# Patient Record
Sex: Male | Born: 1945 | Race: White | Hispanic: No | Marital: Married | Smoking: Former smoker
Health system: Southern US, Community
[De-identification: ages and names within clinical notes are randomized; demographics above are authoritative.]

## PROBLEM LIST (undated history)

## (undated) DIAGNOSIS — G4733 Obstructive sleep apnea (adult) (pediatric): Secondary | ICD-10-CM

## (undated) DIAGNOSIS — I4819 Other persistent atrial fibrillation: Secondary | ICD-10-CM

## (undated) DIAGNOSIS — I10 Essential (primary) hypertension: Secondary | ICD-10-CM

## (undated) DIAGNOSIS — E291 Testicular hypofunction: Secondary | ICD-10-CM

## (undated) DIAGNOSIS — M1990 Unspecified osteoarthritis, unspecified site: Secondary | ICD-10-CM

## (undated) DIAGNOSIS — G2581 Restless legs syndrome: Secondary | ICD-10-CM

## (undated) DIAGNOSIS — Z8739 Personal history of other diseases of the musculoskeletal system and connective tissue: Secondary | ICD-10-CM

## (undated) DIAGNOSIS — R7303 Prediabetes: Secondary | ICD-10-CM

## (undated) DIAGNOSIS — E785 Hyperlipidemia, unspecified: Secondary | ICD-10-CM

## (undated) HISTORY — DX: Other persistent atrial fibrillation: I48.19

## (undated) HISTORY — PX: NO PAST SURGERIES: SHX2092

---

## 2014-09-12 DIAGNOSIS — G2581 Restless legs syndrome: Secondary | ICD-10-CM | POA: Insufficient documentation

## 2017-02-24 ENCOUNTER — Emergency Department: Payer: Medicare Other

## 2017-02-24 ENCOUNTER — Ambulatory Visit (INDEPENDENT_AMBULATORY_CARE_PROVIDER_SITE_OTHER)
Admission: EM | Admit: 2017-02-24 | Discharge: 2017-02-24 | Disposition: A | Discharge status: Other Acute Inpatient Hospital | Payer: Medicare Other | Source: Home / Self Care | Attending: Family Medicine | Admitting: Family Medicine

## 2017-02-24 ENCOUNTER — Encounter: Payer: Self-pay | Admitting: Emergency Medicine

## 2017-02-24 ENCOUNTER — Other Ambulatory Visit: Payer: Self-pay

## 2017-02-24 ENCOUNTER — Observation Stay
Admission: EM | Admit: 2017-02-24 | Discharge: 2017-02-25 | Disposition: A | Discharge status: Home / Self Care | Payer: Medicare Other | Attending: Internal Medicine | Admitting: Internal Medicine

## 2017-02-24 DIAGNOSIS — E7800 Pure hypercholesterolemia, unspecified: Secondary | ICD-10-CM | POA: Insufficient documentation

## 2017-02-24 DIAGNOSIS — R5381 Other malaise: Secondary | ICD-10-CM | POA: Insufficient documentation

## 2017-02-24 DIAGNOSIS — E785 Hyperlipidemia, unspecified: Secondary | ICD-10-CM | POA: Insufficient documentation

## 2017-02-24 DIAGNOSIS — R7303 Prediabetes: Secondary | ICD-10-CM | POA: Diagnosis not present

## 2017-02-24 DIAGNOSIS — R531 Weakness: Secondary | ICD-10-CM | POA: Diagnosis not present

## 2017-02-24 DIAGNOSIS — R42 Dizziness and giddiness: Secondary | ICD-10-CM

## 2017-02-24 DIAGNOSIS — Z79899 Other long term (current) drug therapy: Secondary | ICD-10-CM | POA: Insufficient documentation

## 2017-02-24 DIAGNOSIS — G2581 Restless legs syndrome: Secondary | ICD-10-CM | POA: Insufficient documentation

## 2017-02-24 DIAGNOSIS — M109 Gout, unspecified: Secondary | ICD-10-CM | POA: Diagnosis not present

## 2017-02-24 DIAGNOSIS — Z809 Family history of malignant neoplasm, unspecified: Secondary | ICD-10-CM | POA: Diagnosis not present

## 2017-02-24 DIAGNOSIS — R739 Hyperglycemia, unspecified: Secondary | ICD-10-CM | POA: Diagnosis not present

## 2017-02-24 DIAGNOSIS — Z7982 Long term (current) use of aspirin: Secondary | ICD-10-CM | POA: Insufficient documentation

## 2017-02-24 DIAGNOSIS — G4733 Obstructive sleep apnea (adult) (pediatric): Secondary | ICD-10-CM | POA: Diagnosis not present

## 2017-02-24 DIAGNOSIS — R5383 Other fatigue: Secondary | ICD-10-CM | POA: Insufficient documentation

## 2017-02-24 DIAGNOSIS — I4891 Unspecified atrial fibrillation: Secondary | ICD-10-CM | POA: Diagnosis present

## 2017-02-24 DIAGNOSIS — I481 Persistent atrial fibrillation: Principal | ICD-10-CM | POA: Insufficient documentation

## 2017-02-24 DIAGNOSIS — Z6833 Body mass index (BMI) 33.0-33.9, adult: Secondary | ICD-10-CM | POA: Insufficient documentation

## 2017-02-24 DIAGNOSIS — M1990 Unspecified osteoarthritis, unspecified site: Secondary | ICD-10-CM | POA: Diagnosis not present

## 2017-02-24 DIAGNOSIS — I10 Essential (primary) hypertension: Secondary | ICD-10-CM | POA: Diagnosis not present

## 2017-02-24 HISTORY — DX: Personal history of other diseases of the musculoskeletal system and connective tissue: Z87.39

## 2017-02-24 HISTORY — DX: Obstructive sleep apnea (adult) (pediatric): G47.33

## 2017-02-24 HISTORY — DX: Essential (primary) hypertension: I10

## 2017-02-24 HISTORY — DX: Restless legs syndrome: G25.81

## 2017-02-24 HISTORY — DX: Testicular hypofunction: E29.1

## 2017-02-24 HISTORY — DX: Hyperlipidemia, unspecified: E78.5

## 2017-02-24 HISTORY — DX: Prediabetes: R73.03

## 2017-02-24 HISTORY — DX: Unspecified osteoarthritis, unspecified site: M19.90

## 2017-02-24 LAB — MAGNESIUM: MAGNESIUM: 2 mg/dL (ref 1.7–2.4)

## 2017-02-24 LAB — APTT: APTT: 27 s (ref 24–36)

## 2017-02-24 LAB — CBC WITH DIFFERENTIAL/PLATELET
BASOS PCT: 1 %
Basophils Absolute: 0.1 10*3/uL (ref 0–0.1)
Eosinophils Absolute: 0.2 10*3/uL (ref 0–0.7)
Eosinophils Relative: 4 %
HEMATOCRIT: 54 % — AB (ref 40.0–52.0)
HEMOGLOBIN: 18.3 g/dL — AB (ref 13.0–18.0)
LYMPHS ABS: 1.1 10*3/uL (ref 1.0–3.6)
Lymphocytes Relative: 18 %
MCH: 31.7 pg (ref 26.0–34.0)
MCHC: 34 g/dL (ref 32.0–36.0)
MCV: 93.1 fL (ref 80.0–100.0)
MONOS PCT: 11 %
Monocytes Absolute: 0.7 10*3/uL (ref 0.2–1.0)
NEUTROS ABS: 4 10*3/uL (ref 1.4–6.5)
NEUTROS PCT: 66 %
Platelets: 211 10*3/uL (ref 150–440)
RBC: 5.79 MIL/uL (ref 4.40–5.90)
RDW: 13.6 % (ref 11.5–14.5)
WBC: 6.1 10*3/uL (ref 3.8–10.6)

## 2017-02-24 LAB — TROPONIN I: Troponin I: <0.03 ng/mL (ref <0.03)

## 2017-02-24 LAB — BASIC METABOLIC PANEL
Anion gap: 10 (ref 5–15)
BUN: 19 mg/dL (ref 6–20)
CO2: 20 mmol/L — ABNORMAL LOW (ref 22–32)
Calcium: 9.3 mg/dL (ref 8.9–10.3)
Chloride: 107 mmol/L (ref 101–111)
Creatinine, Ser: 0.82 mg/dL (ref 0.61–1.24)
GFR calc Af Amer: >60 mL/min (ref >60)
GLUCOSE: 139 mg/dL — AB (ref 65–99)
Potassium: 4.2 mmol/L (ref 3.5–5.1)
Sodium: 137 mmol/L (ref 135–145)

## 2017-02-24 LAB — URINE DRUG SCREEN, QUALITATIVE (ARMC ONLY)
Amphetamines, Ur Screen: NOT DETECTED
BARBITURATES, UR SCREEN: NOT DETECTED
BENZODIAZEPINE, UR SCRN: NOT DETECTED
COCAINE METABOLITE, UR ~~LOC~~: NOT DETECTED
Cannabinoid 50 Ng, Ur ~~LOC~~: POSITIVE — AB
MDMA (Ecstasy)Ur Screen: NOT DETECTED
METHADONE SCREEN, URINE: NOT DETECTED
Opiate, Ur Screen: NOT DETECTED
Phencyclidine (PCP) Ur S: NOT DETECTED
TRICYCLIC, UR SCREEN: NOT DETECTED

## 2017-02-24 LAB — PROTIME-INR
INR: 1.1
Prothrombin Time: 14.1 seconds (ref 11.4–15.2)

## 2017-02-24 LAB — TSH: TSH: 1.79 u[IU]/mL (ref 0.350–4.500)

## 2017-02-24 LAB — HEMOGLOBIN A1C
Hgb A1c MFr Bld: 6.2 % — ABNORMAL HIGH (ref 4.8–5.6)
Mean Plasma Glucose: 131.24 mg/dL

## 2017-02-24 LAB — HEPARIN LEVEL (UNFRACTIONATED): HEPARIN UNFRACTIONATED: 0.23 [IU]/mL — AB (ref 0.30–0.70)

## 2017-02-24 MED ORDER — FENOFIBRATE 160 MG PO TABS
160.0000 mg | ORAL_TABLET | Freq: Every day | ORAL | Status: DC
  Administered 2017-02-24 – 2017-02-25 (×2): 160 mg via ORAL
  Filled 2017-02-24 (×2): qty 1

## 2017-02-24 MED ORDER — METOPROLOL TARTRATE 25 MG PO TABS
25.0000 mg | ORAL_TABLET | Freq: Four times a day (QID) | ORAL | Status: DC
  Administered 2017-02-25 (×2): 25 mg via ORAL
  Filled 2017-02-24 (×2): qty 1

## 2017-02-24 MED ORDER — HYDROCODONE-ACETAMINOPHEN 5-325 MG PO TABS: 1.0000 | ORAL_TABLET | ORAL | Status: DC | PRN

## 2017-02-24 MED ORDER — METOPROLOL TARTRATE 25 MG PO TABS
25.0000 mg | ORAL_TABLET | Freq: Two times a day (BID) | ORAL | Status: DC
  Administered 2017-02-24: 25 mg via ORAL
  Filled 2017-02-24: qty 1

## 2017-02-24 MED ORDER — GABAPENTIN 300 MG PO CAPS
300.0000 mg | ORAL_CAPSULE | Freq: Three times a day (TID) | ORAL | Status: DC
Start: 2017-02-24 — End: 2017-02-25
  Administered 2017-02-24 – 2017-02-25 (×3): 300 mg via ORAL
  Filled 2017-02-24 (×3): qty 1

## 2017-02-24 MED ORDER — SENNOSIDES-DOCUSATE SODIUM 8.6-50 MG PO TABS: 1.0000 | ORAL_TABLET | Freq: Every evening | ORAL | Status: DC | PRN

## 2017-02-24 MED ORDER — ALLOPURINOL 300 MG PO TABS
300.0000 mg | ORAL_TABLET | Freq: Every day | ORAL | Status: DC
  Administered 2017-02-24 – 2017-02-25 (×2): 300 mg via ORAL
  Filled 2017-02-24 (×2): qty 1

## 2017-02-24 MED ORDER — ASPIRIN 81 MG PO CHEW
81.0000 mg | CHEWABLE_TABLET | Freq: Every day | ORAL | Status: DC
  Administered 2017-02-24 – 2017-02-25 (×2): 81 mg via ORAL
  Filled 2017-02-24 (×2): qty 1

## 2017-02-24 MED ORDER — ALBUTEROL SULFATE (2.5 MG/3ML) 0.083% IN NEBU: 2.5000 mg | INHALATION_SOLUTION | RESPIRATORY_TRACT | Status: DC | PRN

## 2017-02-24 MED ORDER — DILTIAZEM HCL 30 MG PO TABS
60.0000 mg | ORAL_TABLET | Freq: Three times a day (TID) | ORAL | Status: DC
  Administered 2017-02-24: 60 mg via ORAL
  Filled 2017-02-24: qty 2

## 2017-02-24 MED ORDER — SODIUM CHLORIDE 0.9 % IV SOLN: 250.0000 mL | INTRAVENOUS | Status: DC | PRN

## 2017-02-24 MED ORDER — ONDANSETRON HCL 4 MG/2ML IJ SOLN: 4.0000 mg | Freq: Four times a day (QID) | INTRAMUSCULAR | Status: DC | PRN

## 2017-02-24 MED ORDER — ONDANSETRON HCL 4 MG PO TABS: 4.0000 mg | ORAL_TABLET | Freq: Four times a day (QID) | ORAL | Status: DC | PRN

## 2017-02-24 MED ORDER — SODIUM CHLORIDE 0.9% FLUSH
3.0000 mL | Freq: Two times a day (BID) | INTRAVENOUS | Status: DC
  Administered 2017-02-24 – 2017-02-25 (×2): 3 mL via INTRAVENOUS

## 2017-02-24 MED ORDER — SIMVASTATIN 20 MG PO TABS
40.0000 mg | ORAL_TABLET | Freq: Every day | ORAL | Status: DC
  Administered 2017-02-24 – 2017-02-25 (×2): 40 mg via ORAL
  Filled 2017-02-24 (×2): qty 2

## 2017-02-24 MED ORDER — SODIUM CHLORIDE 0.9% FLUSH: 3.0000 mL | INTRAVENOUS | Status: DC | PRN

## 2017-02-24 MED ORDER — HEPARIN BOLUS VIA INFUSION
1500.0000 [IU] | Freq: Once | INTRAVENOUS | Status: AC
  Administered 2017-02-25: 1500 [IU] via INTRAVENOUS
  Filled 2017-02-24: qty 1500

## 2017-02-24 MED ORDER — LOSARTAN POTASSIUM 50 MG PO TABS
100.0000 mg | ORAL_TABLET | Freq: Every day | ORAL | Status: DC
  Administered 2017-02-24 – 2017-02-25 (×2): 100 mg via ORAL
  Filled 2017-02-24 (×2): qty 2

## 2017-02-24 MED ORDER — HEPARIN BOLUS VIA INFUSION
4000.0000 [IU] | Freq: Once | INTRAVENOUS | Status: AC
  Administered 2017-02-24: 4000 [IU] via INTRAVENOUS
  Filled 2017-02-24: qty 4000

## 2017-02-24 MED ORDER — ACETAMINOPHEN 650 MG RE SUPP: 650.0000 mg | Freq: Four times a day (QID) | RECTAL | Status: DC | PRN

## 2017-02-24 MED ORDER — METOPROLOL TARTRATE 5 MG/5ML IV SOLN: 5.0000 mg | INTRAVENOUS | Status: DC | PRN

## 2017-02-24 MED ORDER — HEPARIN (PORCINE) IN NACL 100-0.45 UNIT/ML-% IJ SOLN
1600.0000 [IU]/h | INTRAMUSCULAR | Status: DC
  Administered 2017-02-24: 1400 [IU]/h via INTRAVENOUS
  Administered 2017-02-25: 1600 [IU]/h via INTRAVENOUS
  Filled 2017-02-24 (×2): qty 250

## 2017-02-24 MED ORDER — METOPROLOL TARTRATE 5 MG/5ML IV SOLN
5.0000 mg | Freq: Once | INTRAVENOUS | Status: AC
  Administered 2017-02-24: 5 mg via INTRAVENOUS
  Filled 2017-02-24: qty 5

## 2017-02-24 MED ORDER — ACETAMINOPHEN 325 MG PO TABS: 650.0000 mg | ORAL_TABLET | Freq: Four times a day (QID) | ORAL | Status: DC | PRN

## 2017-02-24 NOTE — Consult Note (Signed)
Cardiology Consultation:   Patient ID: Jimmy Melendez; [id number]; [birth year]   Admit date: 02/24/2017 Date of Consult: 02/24/2017  Primary Care Provider: [facility], [place] [facility] Primary Cardiologist: New to [facility] - consult by End   Patient Profile:   Jimmy Melendez is a 71 y.o. male with a hx of HTN, HLD, RLS, OSA, gout and pre-diabetes who is being seen today for the evaluation of new onset Afib with RVR at the request of Dr. Imogene Burn, MD.  History of Present Illness:   [name] is without any prior known cardiac history. He noticed a persistent dizziness on 03/08/23 that was more pronounced at times over the weekend, but never fully resolved. At that time, there were no associated palpitations. Never with chest pain, diaphoresis, nausea, vomiting, presyncope or syncope. Never had similar symptoms. He took his BP at home and the machine reported "irregular heart beat" prompting him to go to urgent care on 11/13. At urgent care he was noted to be in new onset Afib with RVR with heart rates in the 120s bpm and have a SBP in the 170s mmHg. Because of those findings he was sent to the ED for further evaluation.   Upon his arrival to [facility] he was noted be in Afib with RV with heart rates ranging into the 150s bpm. BP was elevated into the 170s/120s. CXR not acute. EKG as below. WBC 6.1, HGB 18.3, PLT 211, SCr0.82, K+ 4.2, troponin <0.03, TSH 1.790. Urine drug screen positive for marijuana. He was given ASA in the ED along with IV Lopressor 5 mg. Since his admission he has been placed on short-acting diltiazem with improvement in heart rates into the low 100s bpm, remains in Afib. No further dizziness.   Past Medical History:  Diagnosis Date  . Arthritis   . History of gout   . Hyperlipidemia   . Hypertension   . Hypogonadism in male   . Obstructive sleep apnea   . Prediabetes   . Restless legs syndrome (RLS)     Past Surgical History:  Procedure Laterality Date  . NO PAST SURGERIES        Home Meds: Prior to Admission medications   Medication Sig Start Date End Date Taking? Authorizing Provider  acetaminophen (TYLENOL) 650 MG CR tablet Take 650 mg 2 (two) times daily by mouth.    Yes [provider]  allopurinol (ZYLOPRIM) 300 MG tablet Take 300 mg daily by mouth.   Yes [provider]  aspirin 81 MG chewable tablet Chew 81 mg 3 (three) times a week by mouth.    Yes [provider]  fenofibrate 160 MG tablet Take 160 mg daily by mouth.   Yes [provider]  fexofenadine (ALLEGRA) 180 MG tablet Take 180 mg daily as needed by mouth for allergies or rhinitis.   Yes [provider]  gabapentin (NEURONTIN) 300 MG capsule Take 300 mg at bedtime by mouth.    Yes [provider]  losartan (COZAAR) 100 MG tablet Take 100 mg daily by mouth.   Yes [provider]  simvastatin (ZOCOR) 40 MG tablet Take 40 mg daily by mouth.   Yes [provider]    Inpatient Medications: Scheduled Meds: . allopurinol  300 mg Oral Daily  . aspirin  81 mg Oral Daily  . diltiazem  60 mg Oral Q8H  . fenofibrate  160 mg Oral Daily  . gabapentin  300 mg Oral TID  . heparin  4,000 Units Intravenous  Once  . losartan  100 mg Oral Daily  . simvastatin  40 mg Oral Daily  . sodium chloride flush  3 mL Intravenous Q12H   Continuous Infusions: . sodium chloride    . heparin     PRN Meds: sodium chloride, acetaminophen **OR** acetaminophen, albuterol, HYDROcodone-acetaminophen, metoprolol tartrate, ondansetron **OR** ondansetron (ZOFRAN) IV, senna-docusate, sodium chloride flush  Allergies:  No Known Allergies  Social History:   Social History   Socioeconomic History  . Marital status: Married    Spouse name: Not on file  . Number of children: Not on file  . Years of education: Not on file  . Highest education level: Not on file  Social Needs  . Financial resource strain: Not on file  . Food insecurity - worry: Not on file   . Food insecurity - inability: Not on file  . Transportation needs - medical: Not on file  . Transportation needs - non-medical: Not on file  Occupational History  . Not on file  Tobacco Use  . Smoking status: Never Smoker  . Smokeless tobacco: Never Used  Substance and Sexual Activity  . Alcohol use: Yes    Comment: occasionally  . Drug use: No  . Sexual activity: Not on file  Other Topics Concern  . Not on file  Social History Narrative  . Not on file     Family History:  Family History  Problem Relation Age of Onset  . Cancer Father     ROS:  Review of Systems  Constitutional: Positive for malaise/fatigue. Negative for chills, diaphoresis, fever and weight loss.  HENT: Negative for congestion.   Eyes: Negative for discharge and redness.  Respiratory: Negative for cough, hemoptysis, sputum production, shortness of breath and wheezing.   Cardiovascular: Positive for palpitations. Negative for chest pain, orthopnea, claudication, leg swelling and PND.  Gastrointestinal: Negative for abdominal pain, blood in stool, heartburn, melena, nausea and vomiting.  Genitourinary: Negative for hematuria.  Musculoskeletal: Negative for falls and myalgias.  Skin: Negative for rash.  Neurological: Positive for dizziness. Negative for tingling, tremors, sensory change, speech change, focal weakness, loss of consciousness and weakness.  Endo/Heme/Allergies: Does not bruise/bleed easily.  Psychiatric/Behavioral: Negative for substance abuse. The patient is not nervous/anxious.   All other systems reviewed and are negative.     Physical Exam/Data:   Vitals:   02/24/17 1200 02/24/17 1230 02/24/17 1300 02/24/17 1337  BP: (!) 144/89 (!) 148/125 (!) 143/110 (!) 140/98  Pulse: 92 80 67 97  Resp: 12 (!) 22 16 18   Temp:    (!) 97.5 F (36.4 C)  TempSrc:    Oral  SpO2: 94% 95% 96% 95%  Weight:      Height:       No intake or output data in the 24 hours ending 02/24/17 1543 Filed  Weights   02/24/17 1026  Weight: 245 lb (111.1 kg)   Body mass index is 33.23 kg/m.   Physical Exam: General: Well developed, well nourished, in no acute distress. Head: Normocephalic, atraumatic, sclera non-icteric, no xanthomas, nares without discharge.  Neck: Negative for carotid bruits. JVD not elevated. Lungs: Clear bilaterally to auscultation without wheezes, rales, or rhonchi. Breathing is unlabored. Heart: Mildly tachycardic, irregularly irregular with S1 S2. No murmurs, rubs, or gallops appreciated. Abdomen: Soft, non-tender, non-distended with normoactive bowel sounds. No hepatomegaly. No rebound/guarding. No obvious abdominal masses. Msk:  Strength and tone appear normal for age. Extremities: No clubbing or cyanosis. No edema. Distal pedal pulses are 2+  and equal bilaterally. Neuro: Alert and oriented X 3. No facial asymmetry. No focal deficit. Moves all extremities spontaneously. Psych:  Responds to questions appropriately with a normal affect.   EKG:  The EKG was personally reviewed and demonstrates: Afib with RVR, 101 bpm, baseline wandering, left axis deviation, no acute st/t changes Telemetry:  Telemetry was personally reviewed and demonstrates: Afib with RVR, low 10ss to 120s bpm  Weights: Filed Weights   02/24/17 1026  Weight: 245 lb (111.1 kg)    Relevant CV Studies: Echo pending.   Laboratory Data:  Chemistry Recent Labs  Lab 02/24/17 1037  NA 137  K 4.2  CL 107  CO2 20*  GLUCOSE 139*  BUN 19  CREATININE 0.82  CALCIUM 9.3  GFRNONAA >60  GFRAA >60  ANIONGAP 10    No results for input(s): PROT, ALBUMIN, AST, ALT, ALKPHOS, BILITOT in the last 168 hours. Hematology Recent Labs  Lab 02/24/17 1037  WBC 6.1  RBC 5.79  HGB 18.3*  HCT 54.0*  MCV 93.1  MCH 31.7  MCHC 34.0  RDW 13.6  PLT 211   Cardiac Enzymes Recent Labs  Lab 02/24/17 1037  TROPONINI <0.03   No results for input(s): TROPIPOC in the last 168 hours.  BNPNo results for  input(s): BNP, PROBNP in the last 168 hours.  DDimer No results for input(s): DDIMER in the last 168 hours.  Radiology/Studies:  Dg Chest 2 View  Result Date: 02/24/2017 IMPRESSION: No active cardiopulmonary disease. Electronically Signed   By: Kennith CenterEric  Mansell M.D.   On: 02/24/2017 11:04    Assessment and Plan:   1. New onset Afib with RVR:  -Remains in Afib with heart rates in the low 100s bpm at rest -Agree with short-acting diltiazem 30 gm q 6 hours -Add Metoprolol 25 mg bid -Heparin gtt -Continue to cycle troponin to rule out -Can continue ASA for now until he rules out and will be placed on DOAC in place of ASA -CHADS2VASc at least 2 (HTN, age x 1)  -Transition to DOAC on 11/14 once echo is done and reviewed and he has ruled tou -Will need outpatient sleep study -Consider d-dimer -Magnesium pending -At this time he is tolerating Afib without issues. He will need to ambulate to assess for adequate heart rate control prior to discharge. If he demonstrates adequate heart rate control he could follow up as an outpatient with planned DCCV in 3 weeks once he has been adequately anticoagulated  -If he demonstrates poor heart rate control or becomes symptomatic he would require TEE/DCCV prior to discharge  2. Hyperglycemia:  -Check HGB A1c   3. HTN:  -Add metoprolol as above -Diltiazem as above -Losartan  4. HLD:  -Check lipid   5. OSA: -Needs outpatient sleep study   For questions or updates, please contact [facility] Please consult [email or web address] for contact info under Cardiology/STEMI.   Signed, Eula Listenyan Dunn, PA-C [facility] Pager: [phone] 02/24/2017, 3:43 PM

## 2017-02-24 NOTE — ED Provider Notes (Addendum)
[facility] Emergency Department Provider Note  ____________________________________________   I have reviewed the triage vital signs and the nursing notes.   HISTORY  Chief Complaint Dizziness    HPI Burnis MedinDavid Dromgoole is a 71 y.o. male with a history of hypertension, restless leg syndrome, high cholesterol and gout, no history of cardiac disease that he knows of, states he was in his normal state of health when he began to feel somewhat lightheaded over the weekend Saturday he believes.  He has a computer blood monitor and noted that his machine was reading that he was having an irregular pulse so he came in.  Patient does not believe that he took his blood pressure medications today.  He has had no chest pain or shortness of breath no nausea no vomiting, he is not otherwise symptomatically this except for feeling somewhat lightheaded.  Blood pressure was elevated at the urgent care.  He states he was somewhat anxious about his findings.     Past Medical History:  Diagnosis Date  . Arthritis   . History of gout   . Hyperlipidemia   . Hypertension   . Hypogonadism in male   . Obstructive sleep apnea   . Prediabetes   . Restless legs syndrome (RLS)     There are no active problems to display for this patient.   Past Surgical History:  Procedure Laterality Date  . NO PAST SURGERIES      Prior to Admission medications   Medication Sig Start Date End Date Taking? Authorizing Provider  acetaminophen (TYLENOL) 650 MG CR tablet Take 650 mg every 8 (eight) hours as needed by mouth for pain.    [provider]  allopurinol (ZYLOPRIM) 300 MG tablet Take 300 mg daily by mouth.    [provider]  aspirin 81 MG chewable tablet Chew daily by mouth.    [provider]  fenofibrate 160 MG tablet Take 160 mg daily by mouth.    [provider]  gabapentin (NEURONTIN) 300 MG capsule Take 300 mg 3 (three) times daily by mouth.     [provider]  losartan (COZAAR) 100 MG tablet Take 100 mg daily by mouth.    [provider]  simvastatin (ZOCOR) 40 MG tablet Take 40 mg daily by mouth.    [provider]    Allergies Patient has no known allergies.  Family History  Problem Relation Age of Onset  . Cancer Father     Social History Social History   Tobacco Use  . Smoking status: Never Smoker  . Smokeless tobacco: Never Used  Substance Use Topics  . Alcohol use: Yes    Comment: occasionally  . Drug use: No    Review of Systems Constitutional: No fever/chills Eyes: No visual changes. ENT: No sore throat. No stiff neck no neck pain Cardiovascular: Denies chest pain. Respiratory: Denies shortness of breath. Gastrointestinal:   no vomiting.  No diarrhea.  No constipation. Genitourinary: Negative for dysuria. Musculoskeletal: Negative lower extremity swelling Skin: Negative for rash. Neurological: Negative for severe headaches, focal weakness or numbness.   ____________________________________________   PHYSICAL EXAM:  VITAL SIGNS: ED Triage Vitals  Enc Vitals Group     BP 02/24/17 1025 (!) 156/105     Pulse Rate 02/24/17 1025 (!) 59     Resp 02/24/17 1025 14     Temp 02/24/17 1025 98.7 F (37.1 C)     Temp Source 02/24/17 1025 Oral     SpO2 02/24/17  1025 96 %     Weight 02/24/17 1026 245 lb (111.1 kg)     Height 02/24/17 1026 6' (1.829 m)     Head Circumference --      Peak Flow --      Pain Score 02/24/17 1025 0     Pain Loc --      Pain Edu? --      Excl. in GC? --     Constitutional: Alert and oriented. Well appearing and in no acute distress. Eyes: Conjunctivae are normal Head: Atraumatic HEENT: No congestion/rhinnorhea. Mucous membranes are moist.  Oropharynx non-erythematous Neck:   Nontender with no meningismus, no masses, no stridor Cardiovascular: Irregularly irregular heartbeat though not fast 99, ranging from 90 and 120 on the monitor, grossly  normal heart sounds.  Good peripheral circulation. Respiratory: Normal respiratory effort.  No retractions. Lungs CTAB. Abdominal: Soft and nontender. No distention. No guarding no rebound Back:  There is no focal tenderness or step off.  there is no midline tenderness there are no lesions noted. there is no CVA tenderness Musculoskeletal: No lower extremity tenderness, no upper extremity tenderness. No joint effusions, no DVT signs strong distal pulses no edema Neurologic:  Normal speech and language. No gross focal neurologic deficits are appreciated.  Skin:  Skin is warm, dry and intact. No rash noted. Psychiatric: Mood and affect are normal. Speech and behavior are normal.  ____________________________________________   LABS (all labs ordered are listed, but only abnormal results are displayed)  Labs Reviewed - No data to display  Pertinent labs  results that were available during my care of the patient were reviewed by me and considered in my medical decision making (see chart for details). ____________________________________________  EKG  I personally interpreted any EKGs ordered by me or triage Atrial fibrillation rate 118 bpm, LAD noted, no acute ischemic changes ____________________________________________  RADIOLOGY  Pertinent labs & imaging results that were available during my care of the patient were reviewed by me and considered in my medical decision making (see chart for details). If possible, patient and/or family made aware of any abnormal findings. ____________________________________________    PROCEDURES  Procedure(s) performed: None  Procedures  Critical Care performed: None  ____________________________________________   INITIAL IMPRESSION / ASSESSMENT AND PLAN / ED COURSE  Pertinent labs & imaging results that were available during my care of the patient were reviewed by me and considered in my medical decision making (see chart for  details).  Patient here with new onset atrial fibrillation.  He is otherwise stable.  Heart rate is in the 90s-120s and he is having no chest pain or shortness of breath no evidence of urgency and otherwise.  We will check thyroid, basic blood work including electrolytes, we will give him aspirin, full dose to start with, we will evaluate him closely.  Of note, patient does drink a pot of coffee a day.  At least 5 cups.  ----------------------------------------- 12:12 PM on 02/24/2017 -----------------------------------------  Patient's heart rate still mildly elevated, will give him IV Lopressor after discussion with cardiology, Dr. END, he does recommend that he be admitted and that the hospitalist consider starting anticoagulation.   ____________________________________________   FINAL CLINICAL IMPRESSION(S) / ED DIAGNOSES  Final diagnoses:  None      This chart was dictated using voice recognition software.  Despite best efforts to proofread,  errors can occur which can change meaning.      Jeanmarie Plant, MD 02/24/17 1036    Ileana Roup  A, MD 02/24/17 1213

## 2017-02-24 NOTE — ED Triage Notes (Signed)
Pt via ems from [place] urgent care with new onset a-fib. Pt went to UC because he was having intermittent dizziness x 3 days. Pt took his bp and his bp machine alerted him to irregular HR. Pt alert & oriented; nad noted. Denies pain.

## 2017-02-24 NOTE — ED Notes (Signed)
[facility].

## 2017-02-24 NOTE — Progress Notes (Signed)
ANTICOAGULATION CONSULT NOTE - Initial Consult  Pharmacy Consult for heparin gtt Indication: atrial fibrillation  No Known Allergies  Patient Measurements: Height: 6' (182.9 cm) Weight: 245 lb (111.1 kg) IBW/kg (Calculated) : 77.6 Heparin Dosing Weight: 101.2kg  Vital Signs: Temp: 97.5 F (36.4 C) (11/13 1337) Temp Source: Oral (11/13 1337) BP: 140/98 (11/13 1337) Pulse Rate: 97 (11/13 1337)  Labs: Recent Labs    02/24/17 1037  HGB 18.3*  HCT 54.0*  PLT 211  LABPROT 14.1  INR 1.10  CREATININE 0.82  TROPONINI <0.03    Estimated Creatinine Clearance: 107.9 mL/min (by C-G formula based on SCr of 0.82 mg/dL).   Medical History: Past Medical History:  Diagnosis Date  . Arthritis   . History of gout   . Hyperlipidemia   . Hypertension   . Hypogonadism in male   . Obstructive sleep apnea   . Prediabetes   . Restless legs syndrome (RLS)     Medications:  Medications Prior to Admission  Medication Sig Dispense Refill Last Dose  . acetaminophen (TYLENOL) 650 MG CR tablet Take 650 mg 2 (two) times daily by mouth.    02/23/2017 at 2000  . allopurinol (ZYLOPRIM) 300 MG tablet Take 300 mg daily by mouth.   02/23/2017 at Unknown time  . aspirin 81 MG chewable tablet Chew 81 mg 3 (three) times a week by mouth.    02/23/2017 at Unknown time  . fenofibrate 160 MG tablet Take 160 mg daily by mouth.   02/23/2017 at Unknown time  . fexofenadine (ALLEGRA) 180 MG tablet Take 180 mg daily as needed by mouth for allergies or rhinitis.   prn at prn  . gabapentin (NEURONTIN) 300 MG capsule Take 300 mg at bedtime by mouth.    02/23/2017 at 2000  . losartan (COZAAR) 100 MG tablet Take 100 mg daily by mouth.   02/23/2017 at 0800  . simvastatin (ZOCOR) 40 MG tablet Take 40 mg daily by mouth.   02/23/2017 at Unknown time   Scheduled:  . allopurinol  300 mg Oral Daily  . aspirin  81 mg Oral Daily  . diltiazem  60 mg Oral Q8H  . fenofibrate  160 mg Oral Daily  . gabapentin  300 mg  Oral TID  . heparin  4,000 Units Intravenous Once  . losartan  100 mg Oral Daily  . simvastatin  40 mg Oral Daily  . sodium chloride flush  3 mL Intravenous Q12H   Infusions:  . sodium chloride    . heparin     PRN: sodium chloride, acetaminophen **OR** acetaminophen, albuterol, HYDROcodone-acetaminophen, metoprolol tartrate, ondansetron **OR** ondansetron (ZOFRAN) IV, senna-docusate, sodium chloride flush Anti-infectives (From admission, onward)   None      Goal of Therapy:  Heparin level 0.3-0.7 units/ml Monitor platelets by anticoagulation protocol: Yes   Plan:  Give 4000 units bolus x 1 Start heparin infusion at 1400 units/hr Check anti-Xa level in 6 hours and daily while on heparin Continue to monitor H&H and platelets  Gerre PebblesGarrett Lourie Retz 02/24/2017,3:04 PM

## 2017-02-24 NOTE — ED Triage Notes (Signed)
Patient complains of dizziness that started Saturday off and on. Patient states that he took his blood pressure this morning and it told him that his heart rate was irregular. Patient reports no known cardiac history.

## 2017-02-24 NOTE — H&P (Signed)
[employer] -  at [facility]   PATIENT NAME: Jimmy Melendez Flury    MR#:  [id number]  DATE OF BIRTH:  05/05/1945  DATE OF ADMISSION:  02/24/2017  PRIMARY CARE PHYSICIAN: [facility]   REQUESTING/REFERRING PHYSICIAN: Jeanmarie PlantMcShane, James A, MD  CHIEF COMPLAINT:   Chief Complaint  Patient presents with  . Dizziness    nausea and generalized weakness HISTORY OF PRESENT ILLNESS:  Jimmy Melendez Teehan  is a 71 y.o. male with a known history of hypertension, hyperlipidemia, OSA and restless leg syndrome.  The patient presents the ED with above chief complaints.  Patient was fine until today.  He feels somewhat lightheaded over the weekend and had irregular pulse.  He complains of nausea and dizziness.  But he denies any other symptoms.  He is found A. fib with RVR in the ED, given 1 dose of IV Lopressor.  Heart rate is up to 110-120s. PAST MEDICAL HISTORY:   Past Medical History:  Diagnosis Date  . Arthritis   . History of gout   . Hyperlipidemia   . Hypertension   . Hypogonadism in male   . Obstructive sleep apnea   . Prediabetes   . Restless legs syndrome (RLS)     PAST SURGICAL HISTORY:   Past Surgical History:  Procedure Laterality Date  . NO PAST SURGERIES      SOCIAL HISTORY:   Social History   Tobacco Use  . Smoking status: Never Smoker  . Smokeless tobacco: Never Used  Substance Use Topics  . Alcohol use: Yes    Comment: occasionally    FAMILY HISTORY:   Family History  Problem Relation Age of Onset  . Cancer Father     DRUG ALLERGIES:  No Known Allergies  REVIEW OF SYSTEMS:   Review of Systems  Constitutional: Positive for malaise/fatigue. Negative for chills and fever.  HENT: Negative for sore throat.   Eyes: Negative for blurred vision and double vision.  Respiratory: Negative for cough, hemoptysis, shortness of breath, wheezing and stridor.   Cardiovascular: Negative for chest pain, palpitations, orthopnea and leg  swelling.  Gastrointestinal: Positive for nausea. Negative for abdominal pain, blood in stool, diarrhea, melena and vomiting.  Genitourinary: Negative for dysuria, flank pain and hematuria.  Musculoskeletal: Negative for back pain and joint pain.  Skin: Negative for rash.  Neurological: Positive for dizziness and weakness. Negative for sensory change, focal weakness, seizures, loss of consciousness and headaches.  Endo/Heme/Allergies: Negative for polydipsia.  Psychiatric/Behavioral: Negative for depression. The patient is nervous/anxious.     MEDICATIONS AT HOME:   Prior to Admission medications   Medication Sig Start Date End Date Taking? Authorizing Provider  acetaminophen (TYLENOL) 650 MG CR tablet Take 650 mg 2 (two) times daily by mouth.    Yes [provider]  allopurinol (ZYLOPRIM) 300 MG tablet Take 300 mg daily by mouth.   Yes [provider]  aspirin 81 MG chewable tablet Chew 81 mg 3 (three) times a week by mouth.    Yes [provider]  fenofibrate 160 MG tablet Take 160 mg daily by mouth.   Yes [provider]  fexofenadine (ALLEGRA) 180 MG tablet Take 180 mg daily as needed by mouth for allergies or rhinitis.   Yes [provider]  gabapentin (NEURONTIN) 300 MG capsule Take 300 mg at bedtime by mouth.    Yes [provider]  losartan (COZAAR) 100 MG tablet Take 100 mg daily by mouth.   Yes [provider]  simvastatin (ZOCOR) 40 MG tablet Take 40 mg daily by mouth.   Yes [provider]      VITAL SIGNS:  Blood pressure (!) 140/98, pulse 97, temperature (!) 97.5 F (36.4 C), temperature source Oral, resp. rate 18, height 6' (1.829 m), weight 245 lb (111.1 kg), SpO2 95 %.  PHYSICAL EXAMINATION:  Physical Exam  GENERAL:  71 y.o.-year-old patient lying in the bed with no acute distress.  Morbid obesity. EYES: Pupils equal, round, reactive to light and accommodation. No scleral icterus. Extraocular  muscles intact.  HEENT: Head atraumatic, normocephalic. Oropharynx and nasopharynx clear.  NECK:  Supple, no jugular venous distention. No thyroid enlargement, no tenderness.  LUNGS: Normal breath sounds bilaterally, no wheezing, rales,rhonchi or crepitation. No use of accessory muscles of respiration.  CARDIOVASCULAR: Irregular rate rhythm, tachycardia. No murmurs, rubs, or gallops.  ABDOMEN: Soft, nontender, nondistended. Bowel sounds present. No organomegaly or mass.  EXTREMITIES: No pedal edema, cyanosis, or clubbing.  NEUROLOGIC: Cranial nerves II through XII are intact. Muscle strength 5/5 in all extremities. Sensation intact. Gait not checked.  PSYCHIATRIC: The patient is alert and oriented x 3.  SKIN: No obvious rash, lesion, or ulcer.   LABORATORY PANEL:   CBC Recent Labs  Lab 02/24/17 1037  WBC 6.1  HGB 18.3*  HCT 54.0*  PLT 211   ------------------------------------------------------------------------------------------------------------------  Chemistries  Recent Labs  Lab 02/24/17 1037  NA 137  K 4.2  CL 107  CO2 20*  GLUCOSE 139*  BUN 19  CREATININE 0.82  CALCIUM 9.3   ------------------------------------------------------------------------------------------------------------------  Cardiac Enzymes Recent Labs  Lab 02/24/17 1037  TROPONINI <0.03   ------------------------------------------------------------------------------------------------------------------  RADIOLOGY:  Dg Chest 2 View  Result Date: 02/24/2017 CLINICAL DATA:  Atrial fibrillation. EXAM: CHEST  2 VIEW COMPARISON:  None. FINDINGS: Lungs are hyperexpanded. The lungs are clear without focal pneumonia, edema, pneumothorax or pleural effusion. The cardiopericardial silhouette is within normal limits for size. The visualized bony structures of the thorax are intact. IMPRESSION: No active cardiopulmonary disease. Electronically Signed   By: Kennith CenterEric  Mansell M.D.   On: 02/24/2017 11:04       IMPRESSION AND PLAN:   New onset A. Fib. The patient will be placed for observation. Start heparin drip pharmacy to dose, Cardizem 60 mg every 8 hours, IV Lopressor as needed.  Echocardiograph cardiology consult.  Hypertension.  Continue hypertension medication. Morbid obesity and OSA.  CPAP at night.  All the records are reviewed and case discussed with ED provider. Management plans discussed with the patient, family and they are in agreement.  CODE STATUS: Full code  TOTAL TIME TAKING CARE OF THIS PATIENT: 53 minutes.    Shaune Pollackhen, Naiya Corral M.D on 02/24/2017 at 2:51 PM  Between 7am to 6pm - Pager - [phone]  After 6pm go to [email or web address] - Social research [email or web address], [email or web address] [email or web address]  [facility] [place] [facility]  Office  [phone]  CC: Primary care physician; [facility]   Note: This dictation was prepared with Dragon dictation along with smaller phrase technology. Any transcriptional errors that result from this process are unin

## 2017-02-24 NOTE — ED Notes (Signed)
EMS called to transport patient to [facility] ED 

## 2017-02-24 NOTE — ED Provider Notes (Addendum)
[facility]    CSN: [id number] Arrival date & time: 02/24/17  0849  History   Chief Complaint Chief Complaint  Patient presents with  . Dizziness   HPI  71 year old male with the past medical history listed below presents with dizziness.  Patient states that he has had ongoing dizziness since Saturday.  He states that he took his blood pressure at home and his meter told him that he was experiencing an irregular heartbeat.  He states that this persisted over the weekend and today he decided to come in for evaluation.  He has no chest pain or shortness of breath.  He endorses compliance with his medications.  No known exacerbating or relieving factors.  No other reported symptoms.  No other complaints or concerns at this time.  Patient denies any prior cardiac history.  No family history of cardiac disease.  Past Medical History:  Diagnosis Date  . Arthritis   . History of gout   . Hyperlipidemia   . Hypertension   . Hypogonadism in male   . Obstructive sleep apnea   . Prediabetes   . Restless legs syndrome (RLS)    Past Surgical History:  Procedure Laterality Date  . NO PAST SURGERIES      Home Medications    Prior to Admission medications   Medication Sig Start Date End Date Taking? Authorizing Provider  acetaminophen (TYLENOL) 650 MG CR tablet Take 650 mg every 8 (eight) hours as needed by mouth for pain.   Yes [provider]  allopurinol (ZYLOPRIM) 300 MG tablet Take 300 mg daily by mouth.   Yes [provider]  aspirin 81 MG chewable tablet Chew daily by mouth.   Yes [provider]  fenofibrate 160 MG tablet Take 160 mg daily by mouth.   Yes [provider]  gabapentin (NEURONTIN) 300 MG capsule Take 300 mg 3 (three) times daily by mouth.   Yes [provider]  losartan (COZAAR) 100 MG tablet Take 100 mg daily by mouth.   Yes [provider]  simvastatin (ZOCOR) 40 MG tablet Take 40 mg daily by mouth.    Yes [provider]    Family History Family History  Problem Relation Age of Onset  . Cancer Father    Social History Social History   Tobacco Use  . Smoking status: Never Smoker  . Smokeless tobacco: Never Used  Substance Use Topics  . Alcohol use: Yes    Comment: occasionally  . Drug use: No     Allergies   Patient has no known allergies.   Review of Systems Review of Systems  Respiratory: Negative for shortness of breath.   Cardiovascular: Negative for chest pain.       Irregular heartbeat.  Neurological: Positive for dizziness.  All other systems reviewed and are negative.  Physical Exam Triage Vital Signs ED Triage Vitals [02/24/17 0903]  Enc Vitals Group     BP (!) 173/107     Pulse Rate (!) 121     Resp 16     Temp 97.7 F (36.5 C)     Temp Source Oral     SpO2 100 %     Weight 245 lb (111.1 kg)     Height 6' (1.829 m)     Head Circumference      Peak Flow      Pain Score 0     Pain Loc      Pain Edu?  Excl. in GC?    Updated Vital Signs BP (!) 176/122 (BP Location: Right Arm)   Pulse (!) 151   Temp 97.7 F (36.5 C) (Oral)   Resp 16   Ht 6' (1.829 m)   Wt 245 lb (111.1 kg)   SpO2 100%   BMI 33.23 kg/m     Physical Exam  Constitutional: He is oriented to person, place, and time. He appears well-developed and well-nourished. No distress.  HENT:  Head: Normocephalic and atraumatic.  Nose: Nose normal.  Eyes: Conjunctivae are normal. No scleral icterus.  Neck: Normal range of motion. No tracheal deviation present.  Cardiovascular:  No murmur heard. Irregularly irregular.  Tachycardic.  Pulmonary/Chest: Effort normal and breath sounds normal. He has no wheezes. He has no rales.  Abdominal: Soft. He exhibits no distension. There is no tenderness. There is no rebound and no guarding.  Neurological: He is alert and oriented to person, place, and time. He exhibits normal muscle tone.  Skin: Skin is warm. No rash noted.    Psychiatric: He has a normal mood and affect. His behavior is normal.  Vitals reviewed.  UC Treatments / Results  Labs (all labs ordered are listed, but only abnormal results are displayed) Labs Reviewed - No data to display  EKG EKG interpretation: Atrial fibrillation with rapid ventricular response.  Incomplete left bundle.  Left axis deviation.  Radiology No results found.  Procedures Procedures (including critical care time)  Medications Ordered in UC Medications - No data to display   Initial Impression / Assessment and Plan / UC Course  I have reviewed the triage vital signs and the nursing notes.  Pertinent labs & imaging results that were available during my care of the patient were reviewed by me and considered in my medical decision making (see chart for details).     71 year old male presents with new onset atrial fibrillation with rapid ventricular response.  Patient minimally symptomatic.  However, given new onset atrial fibrillation as well as markedly elevated blood pressure, patient needs to go to the ER for laboratory studies, and treatment.  He is a candidate for anticoagulation as his CHADSVASc score is 2.   Final Clinical Impressions(s) / UC Diagnoses   Final diagnoses:  New onset atrial fibrillation [facility](HCC)    ED Discharge Orders    None     Controlled Substance Prescriptions [place] Controlled Substance Registry consulted? Not Applicable   Tommie SamsCook, Kalon Erhardt G, DO 02/24/17 1001    Everlene Otherook, Eldred Lievanos G, DO 02/24/17 1001

## 2017-02-24 NOTE — Progress Notes (Addendum)
ANTICOAGULATION CONSULT NOTE - Initial Consult  Pharmacy Consult for heparin gtt Indication: atrial fibrillation  No Known Allergies  Patient Measurements: Height: 6' (182.9 cm) Weight: 245 lb (111.1 kg) IBW/kg (Calculated) : 77.6 Heparin Dosing Weight: 101.2kg  Vital Signs: Temp: 97.7 F (36.5 C) (11/13 2023) Temp Source: Oral (11/13 2023) BP: 118/68 (11/13 2023) Pulse Rate: 57 (11/13 2023)  Labs: Recent Labs    02/24/17 1037 02/24/17 1535 02/24/17 2151  HGB 18.3*  --   --   HCT 54.0*  --   --   PLT 211  --   --   APTT  --  27  --   LABPROT 14.1  --   --   INR 1.10  --   --   HEPARINUNFRC  --   --  0.23*  CREATININE 0.82  --   --   TROPONINI <0.03 <0.03 <0.03    Estimated Creatinine Clearance: 107.9 mL/min (by C-G formula based on SCr of 0.82 mg/dL).   Medical History: Past Medical History:  Diagnosis Date  . Arthritis   . History of gout   . Hyperlipidemia   . Hypertension   . Hypogonadism in male   . Obstructive sleep apnea   . Prediabetes   . Restless legs syndrome (RLS)     Medications:  Medications Prior to Admission  Medication Sig Dispense Refill Last Dose  . acetaminophen (TYLENOL) 650 MG CR tablet Take 650 mg 2 (two) times daily by mouth.    02/23/2017 at 2000  . allopurinol (ZYLOPRIM) 300 MG tablet Take 300 mg daily by mouth.   02/23/2017 at Unknown time  . aspirin 81 MG chewable tablet Chew 81 mg 3 (three) times a week by mouth.    02/23/2017 at Unknown time  . fenofibrate 160 MG tablet Take 160 mg daily by mouth.   02/23/2017 at Unknown time  . fexofenadine (ALLEGRA) 180 MG tablet Take 180 mg daily as needed by mouth for allergies or rhinitis.   prn at prn  . gabapentin (NEURONTIN) 300 MG capsule Take 300 mg at bedtime by mouth.    02/23/2017 at 2000  . losartan (COZAAR) 100 MG tablet Take 100 mg daily by mouth.   02/23/2017 at 0800  . simvastatin (ZOCOR) 40 MG tablet Take 40 mg daily by mouth.   02/23/2017 at Unknown time   Scheduled:   . allopurinol  300 mg Oral Daily  . aspirin  81 mg Oral Daily  . fenofibrate  160 mg Oral Daily  . gabapentin  300 mg Oral TID  . heparin  1,500 Units Intravenous Once  . losartan  100 mg Oral Daily  . [START ON 02/25/2017] metoprolol tartrate  25 mg Oral Q6H  . simvastatin  40 mg Oral Daily  . sodium chloride flush  3 mL Intravenous Q12H   Infusions:  . sodium chloride    . heparin 1,400 Units/hr (02/24/17 1603)   PRN: sodium chloride, acetaminophen **OR** acetaminophen, albuterol, HYDROcodone-acetaminophen, ondansetron **OR** ondansetron (ZOFRAN) IV, senna-docusate, sodium chloride flush Anti-infectives (From admission, onward)   None      Goal of Therapy:  Heparin level 0.3-0.7 units/ml Monitor platelets by anticoagulation protocol: Yes   Plan:  Give 4000 units bolus x 1 Start heparin infusion at 1400 units/hr Check anti-Xa level in 6 hours and daily while on heparin Continue to monitor H&H and platelets    11/13 PM heparin level 0.23. 1500 unit bolus and increase rate to 1600 units/hr. Recheck heparin level and CBC  with AM labs.  11/14 0300 heparin level 0.57. Recheck in 6 hours to confirm.  .Trelon Plush S 02/24/2017,11:10 PM

## 2017-02-24 NOTE — Plan of Care (Signed)
  Progressing Clinical Measurements: Ability to maintain clinical measurements within normal limits will improve 02/24/2017 1432 - Progressing by Tomie ChinaJackson, Shelsea Hangartner Cecelie, RN Will remain free from infection 02/24/2017 1432 - Progressing by Tomie ChinaJackson, Gwendelyn Lanting Cecelie, RN Diagnostic test results will improve 02/24/2017 1432 - Progressing by Tomie ChinaJackson, Joron Velis Cecelie, RN Respiratory complications will improve 02/24/2017 1432 - Progressing by Tomie ChinaJackson, Ruble Buttler Cecelie, RN Cardiovascular complication will be avoided 02/24/2017 1432 - Progressing by Tomie ChinaJackson, Ned Kakar Cecelie, RN Activity: Risk for activity intolerance will decrease 02/24/2017 1432 - Progressing by Tomie ChinaJackson, Tsuyako Jolley Cecelie, RN Elimination: Will not experience complications related to bowel motility 02/24/2017 1432 - Progressing by Tomie ChinaJackson, Nathanie Ottley Cecelie, RN Will not experience complications related to urinary retention 02/24/2017 1432 - Progressing by Tomie ChinaJackson, Trigg Delarocha Cecelie, RN Pain Managment: General experience of comfort will improve 02/24/2017 1432 - Progressing by Tomie ChinaJackson, [place] Agent Cecelie, RN Education: Knowledge of disease or condition will improve 02/24/2017 1432 - Progressing by Tomie ChinaJackson, Quasim Doyon Cecelie, RN Understanding of medication regimen will improve 02/24/2017 1432 - Progressing by Tomie ChinaJackson, Ada Holness Cecelie, RN Activity: Ability to tolerate increased activity will improve 02/24/2017 1432 - Progressing by Tomie ChinaJackson, Aliyanah Rozas Cecelie, RN Cardiac: Ability to achieve and maintain adequate cardiopulmonary perfusion will improve 02/24/2017 1432 - Progressing by Tomie ChinaJackson, Vee Bahe Cecelie, RN Health Behavior/Discharge Planning: Ability to safely manage health-related needs after discharge will improve 02/24/2017 1432 - Progressing by Tomie ChinaJackson, Demeshia Sherburne Cecelie, RN

## 2017-02-25 ENCOUNTER — Telehealth: Payer: Self-pay

## 2017-02-25 DIAGNOSIS — E782 Mixed hyperlipidemia: Secondary | ICD-10-CM

## 2017-02-25 DIAGNOSIS — R0602 Shortness of breath: Secondary | ICD-10-CM

## 2017-02-25 DIAGNOSIS — I481 Persistent atrial fibrillation: Secondary | ICD-10-CM

## 2017-02-25 DIAGNOSIS — I10 Essential (primary) hypertension: Secondary | ICD-10-CM | POA: Diagnosis not present

## 2017-02-25 DIAGNOSIS — R42 Dizziness and giddiness: Secondary | ICD-10-CM

## 2017-02-25 LAB — BASIC METABOLIC PANEL
ANION GAP: 10 (ref 5–15)
BUN: 19 mg/dL (ref 6–20)
CHLORIDE: 107 mmol/L (ref 101–111)
CO2: 19 mmol/L — AB (ref 22–32)
Calcium: 8.9 mg/dL (ref 8.9–10.3)
Creatinine, Ser: 0.89 mg/dL (ref 0.61–1.24)
GFR calc non Af Amer: >60 mL/min (ref >60)
GLUCOSE: 134 mg/dL — AB (ref 65–99)
Potassium: 4.1 mmol/L (ref 3.5–5.1)
Sodium: 136 mmol/L (ref 135–145)

## 2017-02-25 LAB — CBC
HCT: 52.4 % — ABNORMAL HIGH (ref 40.0–52.0)
Hemoglobin: 17.5 g/dL (ref 13.0–18.0)
MCH: 31.4 pg (ref 26.0–34.0)
MCHC: 33.4 g/dL (ref 32.0–36.0)
MCV: 93.9 fL (ref 80.0–100.0)
Platelets: 217 10*3/uL (ref 150–440)
RBC: 5.58 MIL/uL (ref 4.40–5.90)
RDW: 13.3 % (ref 11.5–14.5)
WBC: 7.3 10*3/uL (ref 3.8–10.6)

## 2017-02-25 LAB — TROPONIN I: Troponin I: <0.03 ng/mL (ref <0.03)

## 2017-02-25 LAB — LIPID PANEL
Cholesterol: 128 mg/dL (ref 0–200)
HDL: 25 mg/dL — AB (ref >40)
LDL CALC: 68 mg/dL (ref 0–99)
TRIGLYCERIDES: 176 mg/dL — AB (ref <150)
Total CHOL/HDL Ratio: 5.1 RATIO
VLDL: 35 mg/dL (ref 0–40)

## 2017-02-25 LAB — HEPARIN LEVEL (UNFRACTIONATED)
Heparin Unfractionated: 0.57 IU/mL (ref 0.30–0.70)
Heparin Unfractionated: 0.39 IU/mL (ref 0.30–0.70)

## 2017-02-25 MED ORDER — METOPROLOL TARTRATE 25 MG PO TABS: 75.0000 mg | ORAL_TABLET | Freq: Two times a day (BID) | ORAL | Status: DC

## 2017-02-25 MED ORDER — METOPROLOL TARTRATE 75 MG PO TABS: 75.0000 mg | ORAL_TABLET | Freq: Two times a day (BID) | ORAL | 0 refills | Status: DC

## 2017-02-25 MED ORDER — APIXABAN 5 MG PO TABS
5.0000 mg | ORAL_TABLET | Freq: Two times a day (BID) | ORAL | Status: DC
  Administered 2017-02-25: 5 mg via ORAL
  Filled 2017-02-25: qty 1

## 2017-02-25 MED ORDER — APIXABAN 5 MG PO TABS: 5.0000 mg | ORAL_TABLET | Freq: Two times a day (BID) | ORAL | 0 refills | Status: DC

## 2017-02-25 NOTE — Care Management Obs Status (Signed)
MEDICARE OBSERVATION STATUS NOTIFICATION   Patient Details  Name: Jimmy Melendez MRN: [id number] Date of Birth: [birth year]   Medicare Observation Status Notification Given:  No Discharge < 24 hours of being placed in observatoin   Eber HongGreene, Rob Mciver R, RN 02/25/2017, 12:11 PM

## 2017-02-25 NOTE — Discharge Instructions (Signed)
Heart healthy and ADA diet. °

## 2017-02-25 NOTE — Care Management Note (Signed)
Case Management Note  Patient Details  Name: Jimmy MedinDavid Melendez MRN: [id number] Date of Birth: 09-29-1945  Subjective/Objective:                 Placed in observation for new onset atrial fib.  No issues accessing medical care, obtaining meds or with transportation. To discharge on Eliquis.  Confirms he has pharmacy coverage with his medicare plan.   Action/Plan:  Provided with Eliquis 30 day coupon  Expected Discharge Date:  02/25/17               Expected Discharge Plan:     In-House Referral:     Discharge planning Services     Post Acute Care Choice:    Choice offered to:     DME Arranged:    DME Agency:     HH Arranged:    HH Agency:     Status of Service:     If discussed at [employer] Meetings, dates discussed:    Additional Comments:  Eber HongGreene, Ciana Simmon R, RN 02/25/2017, 12:23 PM

## 2017-02-25 NOTE — Telephone Encounter (Signed)
Admitted

## 2017-02-25 NOTE — Discharge Summary (Signed)
[employer] [facility] [place] [facility]   PATIENT NAME: Jimmy Melendez    MR#:  [id number]  DATE OF BIRTH:  May 06, 1945  DATE OF ADMISSION:  02/24/2017   ADMITTING PHYSICIAN: Jimmy PollackQing Jeevan Kalla, MD  DATE OF DISCHARGE: 02/25/2017  3:01 PM  PRIMARY CARE PHYSICIAN: [facility], [facility]   ADMISSION DIAGNOSIS:  Dizziness [R42] Atrial fibrillation, unspecified type (HCC) [I48.91] DISCHARGE DIAGNOSIS:  Active Problems:   A-fib (HCC)  SECONDARY DIAGNOSIS:   Past Medical History:  Diagnosis Date  . Arthritis   . History of gout   . Hyperlipidemia   . Hypertension   . Hypogonadism in male   . Obstructive sleep apnea   . Prediabetes   . Restless legs syndrome (RLS)    HOSPITAL COURSE:   New onset A. Fib. He was found heparin drip pharmacy to dose, IV Lopressor as needed.  Start Lopressor 75 mg twice daily and Eliquis, echo as outpatient per Jimmy Melendez. He has no symptoms and HR is controlled.  Hypertension. Continue hypertension medication. Morbid obesity and OSA. CPAP at night. Discussed with Jimmy Melendez. DISCHARGE CONDITIONS:  Stable, discharge to home today. CONSULTS OBTAINED:  Treatment Team:  Jimmy Melendez, Christopher, MD DRUG ALLERGIES:  No Known Allergies DISCHARGE MEDICATIONS:   Allergies as of 02/25/2017   No Known Allergies     Medication List    TAKE these medications   acetaminophen 650 MG CR tablet Commonly known as:  TYLENOL Take 650 mg 2 (two) times daily by mouth.   allopurinol 300 MG tablet Commonly known as:  ZYLOPRIM Take 300 mg daily by mouth.   apixaban 5 MG Tabs tablet Commonly known as:  ELIQUIS Take 1 tablet (5 mg total) 2 (two) times daily by mouth.   aspirin 81 MG chewable tablet Chew 81 mg 3 (three) times a week by mouth.   fenofibrate 160 MG tablet Take 160 mg daily by mouth.   fexofenadine 180 MG tablet Commonly known as:  ALLEGRA Take 180 mg daily as needed by mouth for allergies or rhinitis.   gabapentin 300 MG  capsule Commonly known as:  NEURONTIN Take 300 mg at bedtime by mouth.   losartan 100 MG tablet Commonly known as:  COZAAR Take 100 mg daily by mouth.   Metoprolol Tartrate 75 MG Tabs Take 75 mg 2 (two) times daily by mouth.   simvastatin 40 MG tablet Commonly known as:  ZOCOR Take 40 mg daily by mouth.        DISCHARGE INSTRUCTIONS:  See AVS.   If you experience worsening of your admission symptoms, develop shortness of breath, life threatening emergency, suicidal or homicidal thoughts you must seek medical attention immediately by calling 911 or calling your MD immediately  if symptoms less severe.  You Must read complete instructions/literature along with all the possible adverse reactions/side effects for all the Medicines you take and that have been prescribed to you. Take any new Medicines after you have completely understood and accpet all the possible adverse reactions/side effects.   Please note  You were cared for by a hospitalist during your hospital stay. If you have any questions about your discharge medications or the care you received while you were in the hospital after you are discharged, you can call the unit and asked to speak with the hospitalist on call if the hospitalist that took care of you is not available. Once you are discharged, your primary care physician will handle any further medical issues. Please note that NO  REFILLS for any discharge medications will be authorized once you are discharged, as it is imperative that you return to your primary care physician (or establish a relationship with a primary care physician if you do not have one) for your aftercare needs so that they can reassess your need for medications and monitor your lab values.    On the day of Discharge:  VITAL SIGNS:  Blood pressure 126/65, pulse 77, temperature 97.6 F (36.4 C), temperature source Oral, resp. rate 18, height 6' (1.829 m), weight 245 lb (111.1 kg), SpO2 96  %. PHYSICAL EXAMINATION:  GENERAL:  71 y.o.-year-old patient lying in the bed with no acute distress.  EYES: Pupils equal, round, reactive to light and accommodation. No scleral icterus. Extraocular muscles intact.  HEENT: Head atraumatic, normocephalic. Oropharynx and nasopharynx clear.  NECK:  Supple, no jugular venous distention. No thyroid enlargement, no tenderness.  LUNGS: Normal breath sounds bilaterally, no wheezing, rales,rhonchi or crepitation. No use of accessory muscles of respiration.  CARDIOVASCULAR: S1, S2 normal. No murmurs, rubs, or gallops.  ABDOMEN: Soft, non-tender, non-distended. Bowel sounds present. No organomegaly or mass.  EXTREMITIES: No pedal edema, cyanosis, or clubbing.  NEUROLOGIC: Cranial nerves II through XII are intact. Muscle strength 5/5 in all extremities. Sensation intact. Gait not checked.  PSYCHIATRIC: The patient is alert and oriented x 3.  SKIN: No obvious rash, lesion, or ulcer.  DATA REVIEW:   CBC Recent Labs  Lab 02/25/17 0312  WBC 7.3  HGB 17.5  HCT 52.4*  PLT 217    Chemistries  Recent Labs  Lab 02/24/17 1037 02/25/17 0312  NA 137 136  K 4.2 4.1  CL 107 107  CO2 20* 19*  GLUCOSE 139* 134*  BUN 19 19  CREATININE 0.82 0.89  CALCIUM 9.3 8.9  MG 2.0  --      Microbiology Results  No results found for this or any previous visit.  RADIOLOGY:  No results found.   Management plans discussed with the patient, family and they are in agreement.  CODE STATUS: Full Code   TOTAL TIME TAKING CARE OF THIS PATIENT: 27 minutes.    Jimmy Pollackhen, [name] M.D on 02/25/2017 at 3:59 PM  Between 7am to 6pm - Pager - [phone]  After 6pm go to [email or web address] - Social research officer, governmentpassword [email or web address] [facility]  [facility] [place] [facility]  Office  [phone]  CC: Primary care physician; [facility] [place] [facility]   Note: This dictation was prepared with Dragon dictation along with smaller phrase technology. Any transcriptional errors that result from this  process are unintentional.

## 2017-02-25 NOTE — Progress Notes (Signed)
ANTICOAGULATION CONSULT NOTE -follow up  Pharmacy Consult for heparin gtt Indication: atrial fibrillation  No Known Allergies  Patient Measurements: Height: 6' (182.9 cm) Weight: 245 lb (111.1 kg) IBW/kg (Calculated) : 77.6 Heparin Dosing Weight: 101.2kg  Vital Signs: Temp: 97.6 F (36.4 C) (11/14 0944) Temp Source: Oral (11/14 0944) BP: 126/65 (11/14 0944) Pulse Rate: 77 (11/14 0944)  Labs: Recent Labs    02/24/17 1037 02/24/17 1535 02/24/17 2151 02/25/17 0312 02/25/17 0904  HGB 18.3*  --   --  17.5  --   HCT 54.0*  --   --  52.4*  --   PLT 211  --   --  217  --   APTT  --  27  --   --   --   LABPROT 14.1  --   --   --   --   INR 1.10  --   --   --   --   HEPARINUNFRC  --   --  0.23* 0.57 0.39  CREATININE 0.82  --   --  0.89  --   TROPONINI <0.03 <0.03 <0.03 <0.03  --     Estimated Creatinine Clearance: 99.4 mL/min (by C-G formula based on SCr of 0.89 mg/dL).   Medical History: Past Medical History:  Diagnosis Date  . Arthritis   . History of gout   . Hyperlipidemia   . Hypertension   . Hypogonadism in male   . Obstructive sleep apnea   . Prediabetes   . Restless legs syndrome (RLS)     Medications:  Medications Prior to Admission  Medication Sig Dispense Refill Last Dose  . acetaminophen (TYLENOL) 650 MG CR tablet Take 650 mg 2 (two) times daily by mouth.    02/23/2017 at 2000  . allopurinol (ZYLOPRIM) 300 MG tablet Take 300 mg daily by mouth.   02/23/2017 at Unknown time  . aspirin 81 MG chewable tablet Chew 81 mg 3 (three) times Melendez week by mouth.    02/23/2017 at Unknown time  . fenofibrate 160 MG tablet Take 160 mg daily by mouth.   02/23/2017 at Unknown time  . fexofenadine (ALLEGRA) 180 MG tablet Take 180 mg daily as needed by mouth for allergies or rhinitis.   prn at prn  . gabapentin (NEURONTIN) 300 MG capsule Take 300 mg at bedtime by mouth.    02/23/2017 at 2000  . losartan (COZAAR) 100 MG tablet Take 100 mg daily by mouth.   02/23/2017 at  0800  . simvastatin (ZOCOR) 40 MG tablet Take 40 mg daily by mouth.   02/23/2017 at Unknown time   Scheduled:  . allopurinol  300 mg Oral Daily  . aspirin  81 mg Oral Daily  . fenofibrate  160 mg Oral Daily  . gabapentin  300 mg Oral TID  . losartan  100 mg Oral Daily  . metoprolol tartrate  75 mg Oral BID  . simvastatin  40 mg Oral Daily  . sodium chloride flush  3 mL Intravenous Q12H   Infusions:  . sodium chloride    . heparin 1,600 Units/hr (02/25/17 0527)   PRN: sodium chloride, acetaminophen **OR** acetaminophen, albuterol, HYDROcodone-acetaminophen, ondansetron **OR** ondansetron (ZOFRAN) IV, senna-docusate, sodium chloride flush Anti-infectives (From admission, onward)   None      Goal of Therapy:  Heparin level 0.3-0.7 units/ml Monitor platelets by anticoagulation protocol: Yes   Plan:  Give 4000 units bolus x 1 Start heparin infusion at 1400 units/hr Check anti-Xa level in 6 hours and  daily while on heparin Continue to monitor H&H and platelets    11/13 PM heparin level 0.23. 1500 unit bolus and increase rate to 1600 units/hr. Recheck heparin level and CBC with AM labs.  11/14 0300 heparin level 0.57. Recheck in 6 hours to confirm.  11/14 HL @ 0904= 0.39. Will continue current drip and f/u next Heparin level with am labs.  Jimmy Melendez 02/25/2017,9:59 AM

## 2017-02-25 NOTE — Plan of Care (Signed)
  Adequate for Discharge Clinical Measurements: Ability to maintain clinical measurements within normal limits will improve 02/25/2017 1257 - Adequate for Discharge by Weldon PickingAlejo Calderon, Manuella GhaziBerenice, RN Will remain free from infection 02/25/2017 1257 - Adequate for Discharge by Erma HeritageAlejo Calderon, Asah Lamay, RN Diagnostic test results will improve 02/25/2017 1257 - Adequate for Discharge by Erma HeritageAlejo Calderon, Aaditya Letizia, RN Respiratory complications will improve 02/25/2017 1257 - Adequate for Discharge by Erma HeritageAlejo Calderon, Tramell Piechota, RN Cardiovascular complication will be avoided 02/25/2017 1257 - Adequate for Discharge by Erma HeritageAlejo Calderon, Promise Weldin, RN Activity: Risk for activity intolerance will decrease 02/25/2017 1257 - Adequate for Discharge by Erma HeritageAlejo Calderon, Rayhaan Huster, RN Elimination: Will not experience complications related to bowel motility 02/25/2017 1257 - Adequate for Discharge by Weldon PickingAlejo Calderon, Manuella GhaziBerenice, RN Will not experience complications related to urinary retention 02/25/2017 1257 - Adequate for Discharge by Erma HeritageAlejo Calderon, Arlicia Paquette, RN Pain Managment: General experience of comfort will improve 02/25/2017 1257 - Adequate for Discharge by Erma HeritageAlejo Calderon, Rekita Miotke, RN Education: Knowledge of disease or condition will improve 02/25/2017 1257 - Adequate for Discharge by Erma HeritageAlejo Calderon, Kess Mcilwain, RN Understanding of medication regimen will improve 02/25/2017 1257 - Adequate for Discharge by Erma HeritageAlejo Calderon, Tarez Bowns, RN Activity: Ability to tolerate increased activity will improve 02/25/2017 1257 - Adequate for Discharge by Erma HeritageAlejo Calderon, Brexton Sofia, RN Cardiac: Ability to achieve and maintain adequate cardiopulmonary perfusion will improve 02/25/2017 1257 - Adequate for Discharge by Erma HeritageAlejo Calderon, Ilya Ess, RN Health Behavior/Discharge Planning: Ability to safely manage health-related needs after discharge will improve 02/25/2017 1257 - Adequate for Discharge by Erma HeritageAlejo Calderon, Oliverio Cho,  RN

## 2017-02-25 NOTE — Progress Notes (Addendum)
Progress Note  Patient Name: Jimmy Melendez Date of Encounter: 02/25/2017  Primary Cardiologist: [facility] - consult by End  Subjective   No further dizziness. Has ambulated in his room to the restroom without issues or dizziness. Ruled out. Labs stable. Echo pending. On heparin gtt. Remains in Afib with well controlled heart rates in the 60s to 80s bpm.   Inpatient Medications    Scheduled Meds: . allopurinol  300 mg Oral Daily  . aspirin  81 mg Oral Daily  . fenofibrate  160 mg Oral Daily  . gabapentin  300 mg Oral TID  . losartan  100 mg Oral Daily  . metoprolol tartrate  25 mg Oral Q6H  . simvastatin  40 mg Oral Daily  . sodium chloride flush  3 mL Intravenous Q12H   Continuous Infusions: . sodium chloride    . heparin 1,600 Units/hr (02/25/17 0527)   PRN Meds: sodium chloride, acetaminophen **OR** acetaminophen, albuterol, HYDROcodone-acetaminophen, ondansetron **OR** ondansetron (ZOFRAN) IV, senna-docusate, sodium chloride flush   Vital Signs    Vitals:   02/24/17 1300 02/24/17 1337 02/24/17 2023 02/25/17 0352  BP: (!) 143/110 (!) 140/98 118/68 128/87  Pulse: 67 97 (!) 57 70  Resp: 16 18 18 18   Temp:  (!) 97.5 F (36.4 C) 97.7 F (36.5 C) 97.8 F (36.6 C)  TempSrc:  Oral Oral Oral  SpO2: 96% 95% 99% 96%  Weight:      Height:        Intake/Output Summary (Last 24 hours) at 02/25/2017 0725 Last data filed at 02/25/2017 0544 Gross per 24 hour  Intake 507.3 ml  Output 1200 ml  Net -692.7 ml   Filed Weights   02/24/17 1026  Weight: 245 lb (111.1 kg)    Telemetry    Afib, 60s to 80s bpm - Personally Reviewed  ECG    n/a - Personally Reviewed  Physical Exam   GEN: No acute distress.   Neck: No JVD. Cardiac: Irregularly irregular, no murmurs, rubs, or gallops.  Respiratory: Clear to auscultation bilaterally.  GI: Soft, nontender, non-distended.   MS: No edema; No deformity. Neuro:  Alert and oriented x 3; Nonfocal.  Psych: Normal  affect.  Labs    Chemistry Recent Labs  Lab 02/24/17 1037 02/25/17 0312  NA 137 136  K 4.2 4.1  CL 107 107  CO2 20* 19*  GLUCOSE 139* 134*  BUN 19 19  CREATININE 0.82 0.89  CALCIUM 9.3 8.9  GFRNONAA >60 >60  GFRAA >60 >60  ANIONGAP 10 10     Hematology Recent Labs  Lab 02/24/17 1037 02/25/17 0312  WBC 6.1 7.3  RBC 5.79 5.58  HGB 18.3* 17.5  HCT 54.0* 52.4*  MCV 93.1 93.9  MCH 31.7 31.4  MCHC 34.0 33.4  RDW 13.6 13.3  PLT 211 217    Cardiac Enzymes Recent Labs  Lab 02/24/17 1037 02/24/17 1535 02/24/17 2151 02/25/17 0312  TROPONINI <0.03 <0.03 <0.03 <0.03   No results for input(s): TROPIPOC in the last 168 hours.   BNPNo results for input(s): BNP, PROBNP in the last 168 hours.   DDimer No results for input(s): DDIMER in the last 168 hours.   Radiology    Dg Chest 2 View  Result Date: 02/24/2017 IMPRESSION: No active cardiopulmonary disease. Electronically Signed   By: Kennith CenterEric  Mansell M.D.   On: 02/24/2017 11:04    Cardiac Studies   TTE pending  Patient Profile     71 y.o. male with  history of HTN, HLD, RLS, OSA, gout and pre-diabetes who is being seen today for the evaluation of new onset Afib with RVR at the request of Dr. Imogene Burnhen, MD.  Assessment & Plan    1. New onset Afib with RVR:  -Remains in Afib with heart rates in the 60s to 80s bpm -Consolidate metoprolol to bid dosing at 75 mg bid  -Heparin gtt for now until echo is finalized  -Ruled out -Stop ASA -CHADS2VASc at least 2 (HTN, age x 1)  -Transition to DOAC on 11/14 once echo is done and reviewed (he requests case manager to review his insurance for coverage of DOAC)  -Will need outpatient sleep study -Consider d-dimer -Magnesium and potassium at goal -TSH normal -Ambulate in the hallway this AM to assess for well controlled heart rate -At this time he is tolerating Afib without issues. He will need to ambulate to assess for adequate heart rate control prior to discharge. If he  demonstrates adequate heart rate control he could follow up as an outpatient with planned DCCV in 3 weeks once he has been adequately anticoagulated  -If he demonstrates poor heart rate control or becomes symptomatic he would require TEE/DCCV prior to discharge  2. Hyperglycemia:  -HGB A1c 6.2 -Needs outpatient follow up  3. HTN:  -Well controlled -Consolidate metoprolol as above -Losartan  4. HLD:  -LDL 68 -Change simvastatin to Lipitor given possible need for CCB in the future given his Afib  5. OSA: -Needs outpatient sleep study    For questions or updates, please contact [facility] Please consult [email or web address] for contact info under Cardiology/STEMI.    Signed, Eula Listenyan Dunn, PA-C [facility] Pager: [phone] 02/25/2017, 7:25 AM   Attending Note Patient seen and examined, agree with detailed note above,  Patient presentation and plan discussed on rounds.   Has indicated he would like to go home He is ambulated around the floor and reports that he feels well, no significant shortness of breath on exertion Tolerating metoprolol 75 mg twice daily, adequate rate control on telemetry 60-80 bpm Echocardiogram is pending  On physical exam no JVD, lungs clear to auscultation bilaterally, heart sounds regular but regular rate, abdomen obese soft nontender, no significant lower extremity edema  Lab work reviewed showing creatinine 0.89, negative troponin x3, total cholesterol 128, hematocrit 52  A/P: 1) Atrial fibrillation, persistent Long discussion with him concerning various treatment options for his atrial fibrillation He is indicated he would just like to go home Suggested we continue metoprolol at current dose 75 twice daily We have talked to the echocardiogram department, they are over scheduled and may not get to his echocardiogram today in a timely manner -Echocardiogram could be arranged as an outpatient through our office Recommend we start Eliquis  5 mg twice daily We have talked with case manager, they will meet with the patient and provide coupon and discussion -Recommend outpatient sleep study   2. Hyperglycemia:  -HGB A1c 6.2 We have encouraged continued exercise, careful diet management in an effort to lose weight.  3. HTN:   On metoprolol and losartan, blood pressure stable  4. HLD:  -LDL 68 Continue statin  5. OSA: -Needs outpatient sleep study  Significant discussion concerning discharge planning Greater than 50% was spent in counseling and coordination of care with patient Total encounter time 35 minutes or more   Signed: Dossie Arbourim Marlisa Caridi  M.D., Ph.D. [facility]

## 2017-02-25 NOTE — Progress Notes (Signed)
Discharge instructions given. Prescriptions given with coupon. Pt verbalized understanding with no questions. Both IV's out, tele off. Pt transported via wheelchair.

## 2017-02-25 NOTE — Telephone Encounter (Signed)
TCM....  Patient is being discharged   They saw [name]  They are scheduled to see [name] on 03/17/17  They were seen for new on set A-fib  They need to be seen within 2 weeks    Pt added to wait list   Please call

## 2017-02-25 NOTE — Progress Notes (Signed)
ANTICOAGULATION CONSULT NOTE - Initial Consult  Pharmacy Consult for Apixaban Indication: atrial fibrillation  No Known Allergies  Patient Measurements: Height: 6' (182.9 cm) Weight: 245 lb (111.1 kg) IBW/kg (Calculated) : 77.6 Heparin Dosing Weight:    Vital Signs: Temp: 97.6 F (36.4 C) (11/14 0944) Temp Source: Oral (11/14 0944) BP: 126/65 (11/14 0944) Pulse Rate: 77 (11/14 0944)  Labs: Recent Labs    02/24/17 1037 02/24/17 1535 02/24/17 2151 02/25/17 0312 02/25/17 0904  HGB 18.3*  --   --  17.5  --   HCT 54.0*  --   --  52.4*  --   PLT 211  --   --  217  --   APTT  --  27  --   --   --   LABPROT 14.1  --   --   --   --   INR 1.10  --   --   --   --   HEPARINUNFRC  --   --  0.23* 0.57 0.39  CREATININE 0.82  --   --  0.89  --   TROPONINI <0.03 <0.03 <0.03 <0.03  --     Estimated Creatinine Clearance: 99.4 mL/min (by C-G formula based on SCr of 0.89 mg/dL).   Medical History: Past Medical History:  Diagnosis Date  . Arthritis   . History of gout   . Hyperlipidemia   . Hypertension   . Hypogonadism in male   . Obstructive sleep apnea   . Prediabetes   . Restless legs syndrome (RLS)     Medications:  Scheduled:  . allopurinol  300 mg Oral Daily  . apixaban  5 mg Oral BID  . aspirin  81 mg Oral Daily  . fenofibrate  160 mg Oral Daily  . gabapentin  300 mg Oral TID  . losartan  100 mg Oral Daily  . metoprolol tartrate  75 mg Oral BID  . simvastatin  40 mg Oral Daily  . sodium chloride flush  3 mL Intravenous Q12H   Infusions:  . sodium chloride      Assessment: 71 yo male with onset of Afib w/ RVR. TRansitioning from Heparin Drip to apixaban  Goal of Therapy:  Monitor platelets by anticoagulation protocol: Yes   Plan:  Will discontinue Heparin Drip and start Apixaban 5 mg PO bid.  Mariza Bourget A 02/25/2017,11:38 AM

## 2017-02-26 ENCOUNTER — Telehealth: Payer: Self-pay | Admitting: *Deleted

## 2017-02-26 DIAGNOSIS — I4891 Unspecified atrial fibrillation: Secondary | ICD-10-CM

## 2017-02-26 NOTE — Telephone Encounter (Signed)
[facility]   Patient understands to follow up with provider ? [facility] Ward Givenshris Berge, NP.  Patient understands discharge instructions? Yes   Patient understands medications and regiment? Yes   Patient understands to bring all medications to this visit? Yes   Patient aware to arrive 15 min prior appointment.

## 2017-02-26 NOTE — Telephone Encounter (Signed)
Order placed for echocardiogram per Dr. Windell HummingbirdGollan's request. Routing to scheduling to assist.

## 2017-03-11 ENCOUNTER — Other Ambulatory Visit: Payer: Self-pay

## 2017-03-11 ENCOUNTER — Ambulatory Visit (INDEPENDENT_AMBULATORY_CARE_PROVIDER_SITE_OTHER): Payer: Medicare Other

## 2017-03-11 DIAGNOSIS — I4891 Unspecified atrial fibrillation: Secondary | ICD-10-CM

## 2017-03-12 LAB — ECHOCARDIOGRAM COMPLETE
AO mean calculated velocity dopler: 56.5 cm/s
AOVTI: 14.9 cm
AV Area VTI index: 1.28 cm2/m2
AV vel: 2.98
AVAREAMEANV: 2.67 cm2
AVAREAMEANVIN: 1.15 cm2/m2
AVCELMEANRAT: 0.7
AVG: 2 mmHg
Ao-asc: 32 cm
Area-P 1/2: 5.95 cm2
CHL CUP AV VALUE AREA INDEX: 1.28
CHL CUP DOP CALC LVOT VTI: 11.7 cm
CHL CUP MV DEC (S): 127
EERAT: 10.2
EWDT: 127 ms
FS: 23 % — AB (ref 28–44)
IV/PV OW: 1.07
LA ID, A-P, ES: 38 mm
LA diam index: 1.64 cm/m2
LA vol A4C: 92.1 ml
LDCA: 3.8 cm2
LEFT ATRIUM END SYS DIAM: 38 mm
LV PW d: 14 mm — AB (ref 0.6–1.1)
LVEEAVG: 10.2
LVEEMED: 10.2
LVELAT: 10.1 cm/s
LVOT SV: 44 mL
LVOT diameter: 22 mm
LVOTVTI: 0.79 cm
Lateral S' vel: 10.7 cm/s
MV Peak grad: 4 mmHg
MV pk E vel: 103 m/s
P 1/2 time: 37 ms
TAPSE: 20.9 mm
TDI e' lateral: 10.1
TDI e' medial: 8.41
Valve area: 2.98 cm2

## 2017-03-17 ENCOUNTER — Encounter: Payer: Self-pay | Admitting: Nurse Practitioner

## 2017-03-17 ENCOUNTER — Ambulatory Visit (INDEPENDENT_AMBULATORY_CARE_PROVIDER_SITE_OTHER): Payer: Medicare Other | Admitting: Nurse Practitioner

## 2017-03-17 VITALS — BP 132/82 | HR 64 | Ht 72.0 in | Wt 256.5 lb

## 2017-03-17 DIAGNOSIS — I10 Essential (primary) hypertension: Secondary | ICD-10-CM | POA: Diagnosis not present

## 2017-03-17 DIAGNOSIS — I4891 Unspecified atrial fibrillation: Secondary | ICD-10-CM | POA: Diagnosis not present

## 2017-03-17 DIAGNOSIS — E782 Mixed hyperlipidemia: Secondary | ICD-10-CM | POA: Diagnosis not present

## 2017-03-17 DIAGNOSIS — I481 Persistent atrial fibrillation: Secondary | ICD-10-CM

## 2017-03-17 DIAGNOSIS — G4733 Obstructive sleep apnea (adult) (pediatric): Secondary | ICD-10-CM

## 2017-03-17 DIAGNOSIS — I4819 Other persistent atrial fibrillation: Secondary | ICD-10-CM

## 2017-03-17 MED ORDER — METOPROLOL TARTRATE 75 MG PO TABS: 75.0000 mg | ORAL_TABLET | Freq: Two times a day (BID) | ORAL | 3 refills | Status: DC

## 2017-03-17 MED ORDER — APIXABAN 5 MG PO TABS: 5.0000 mg | ORAL_TABLET | Freq: Two times a day (BID) | ORAL | 3 refills | Status: AC

## 2017-03-17 NOTE — Patient Instructions (Addendum)
Medication Instructions:  Please continue your current medications  Labwork: BMET, CBC, PT/INR  Testing/Procedures: Your physician has recommended that you have a Cardioversion (DCCV). Electrical Cardioversion uses a jolt of electricity to your heart either through paddles or wired patches attached to your chest. This is a controlled, usually prescheduled, procedure. Defibrillation is done under light anesthesia in the hospital, and you usually go home the day of the procedure. This is done to get your heart back into a normal rhythm. You are not awake for the procedure. Please see the instruction sheet given to you today. Please call [phone] when you are ready to schedule this.  Follow-Up: After cardioversion  If you need a refill on your cardiac medications before your next appointment, please call your pharmacy.   Electrical Cardioversion Electrical cardioversion is the delivery of a jolt of electricity to restore a normal rhythm to the heart. A rhythm that is too fast or is not regular keeps the heart from pumping well. In this procedure, sticky patches or metal paddles are placed on the chest to deliver electricity to the heart from a device. This procedure may be done in an emergency if:  There is low or no blood pressure as a result of the heart rhythm.  Normal rhythm must be restored as fast as possible to protect the brain and heart from further damage.  It may save a life.  This procedure may also be done for irregular or fast heart rhythms that are not immediately life-threatening. Tell a health care provider about:  Any allergies you have.  All medicines you are taking, including vitamins, herbs, eye drops, creams, and over-the-counter medicines.  Any problems you or family members have had with anesthetic medicines.  Any blood disorders you have.  Any surgeries you have had.  Any medical conditions you have.  Whether you are pregnant or may be  pregnant. What are the risks? Generally, this is a safe procedure. However, problems may occur, including:  Allergic reactions to medicines.  A blood clot that breaks free and travels to other parts of your body.  The possible return of an abnormal heart rhythm within hours or days after the procedure.  Your heart stopping (cardiac arrest). This is rare.  What happens before the procedure? Medicines  Your health care provider may have you start taking: ? Blood-thinning medicines (anticoagulants) so your blood does not clot as easily. ? Medicines may be given to help stabilize your heart rate and rhythm.  Ask your health care provider about changing or stopping your regular medicines. This is especially important if you are taking diabetes medicines or blood thinners. General instructions  Plan to have someone take you home from the hospital or clinic.  If you will be going home right after the procedure, plan to have someone with you for 24 hours.  Follow instructions from your health care provider about eating or drinking restrictions. What happens during the procedure?  To lower your risk of infection: ? Your health care team will wash or sanitize their hands. ? Your skin will be washed with soap.  An IV tube will be inserted into one of your veins.  You will be given a medicine to help you relax (sedative).  Sticky patches (electrodes) or metal paddles may be placed on your chest.  An electrical shock will be delivered. The procedure may vary among health care providers and hospitals. What happens after the procedure?  Your blood pressure, heart rate, breathing rate, and blood  oxygen level will be monitored until the medicines you were given have worn off.  Do not drive for 24 hours if you were given a sedative.  Your heart rhythm will be watched to make sure it does not change. This information is not intended to replace advice given to you by your health care  provider. Make sure you discuss any questions you have with your health care provider. Document Released: 03/21/2002 Document Revised: 11/28/2015 Document Reviewed: 10/05/2015 Elsevier Interactive Patient Education  2017 [employer]  Electrical Cardioversion, Care After This sheet gives you information about how to care for yourself after your procedure. Your health care provider may also give you more specific instructions. If you have problems or questions, contact your health care provider. What can I expect after the procedure? After the procedure, it is common to have:  Some redness on the skin where the shocks were given.  Follow these instructions at home:  Do not drive for 24 hours if you were given a medicine to help you relax (sedative).  Take over-the-counter and prescription medicines only as told by your health care provider.  Ask your health care provider how to check your pulse. Check it often.  Rest for 48 hours after the procedure or as told by your health care provider.  Avoid or limit your caffeine use as told by your health care provider. Contact a health care provider if:  You feel like your heart is beating too quickly or your pulse is not regular.  You have a serious muscle cramp that does not go away. Get help right away if:  You have discomfort in your chest.  You are dizzy or you feel faint.  You have trouble breathing or you are short of breath.  Your speech is slurred.  You have trouble moving an arm or leg on one side of your body.  Your fingers or toes turn cold or blue. This information is not intended to replace advice given to you by your health care provider. Make sure you discuss any questions you have with your health care provider. Document Released: 01/19/2013 Document Revised: 11/02/2015 Document Reviewed: 10/05/2015 Elsevier Interactive Patient Education  [employer]2018 Elsevier Inc.

## 2017-03-17 NOTE — Progress Notes (Signed)
Office Visit    Patient Name: Jimmy MedinDavid Knauer Date of Encounter: 03/17/2017  Primary Care Provider:  [facility] Primary Cardiologist:  Yvonne Kendallhristopher End, MD  Chief Complaint    71 y/o ? with a history of hypertension, hyperlipidemia, sleep apnea, prediabetes, restless leg syndrome, and recent diagnosis of persistent atrial fibrillation, who presents for follow-up.  Past Medical History    Past Medical History:  Diagnosis Date  . Arthritis   . History of gout   . Hyperlipidemia   . Hypertension   . Hypogonadism in male   . Obstructive sleep apnea   . Persistent atrial fibrillation (HCC)    a. Dx 02/2017. CHA2DS2VASc = 2-->Eliquis; b. 02/2017 Echo: Ef 60-65%, no rwma, mild MR, nl LA size, nl PASP.  Marland Kitchen. Prediabetes    a. 02/2017 HbA1c 6.2.  . Restless legs syndrome (RLS)    Past Surgical History:  Procedure Laterality Date  . NO PAST SURGERIES      Allergies  No Known Allergies  History of Present Illness    71 year old ? with a prior history of hypertension, hyperlipidemia, obesity, sleep apnea, prediabetes, restless leg syndrome, arthritis, and gout.  He was recently hospitalized with a 3-day history of lightheadedness and found to be in rapid atrial fibrillation.  He was treated with oral metoprolol with titration, he did have reasonable rate control.  With a CHA2DS2VASc of 2, he was placed on Eliquis and decision was made to discharge him with a plan for outpatient echocardiogram and subsequent cardioversion after 3 weeks of consistent anticoagulation.  Echocardiogram was performed November 28, and showed normal LV function.  Since his discharge, he has done well.  He denies chest pain, dyspnea, palpitations, fatigue, dizziness, syncope, PND, orthopnea, edema, or early satiety.  He is interested in pursuing cardioversion.  He has not missed any doses of Eliquis and up to this point, is tolerating therapy well.  Home Medications    Prior to Admission medications     Medication Sig Start Date End Date Taking? Authorizing Provider  acetaminophen (TYLENOL) 650 MG CR tablet Take 650 mg 2 (two) times daily by mouth.    Yes [provider]  allopurinol (ZYLOPRIM) 300 MG tablet Take 300 mg daily by mouth.   Yes [provider]  apixaban (ELIQUIS) 5 MG TABS tablet Take 1 tablet (5 mg total) 2 (two) times daily by mouth. 02/25/17  Yes Shaune Pollackhen, Qing, MD  fenofibrate 160 MG tablet Take 160 mg daily by mouth.   Yes [provider]  fexofenadine (ALLEGRA) 180 MG tablet Take 180 mg daily as needed by mouth for allergies or rhinitis.   Yes [provider]  gabapentin (NEURONTIN) 300 MG capsule Take 300 mg at bedtime by mouth.    Yes [provider]  losartan (COZAAR) 100 MG tablet Take 100 mg daily by mouth.   Yes [provider]  Metoprolol Tartrate 75 MG TABS Take 75 mg 2 (two) times daily by mouth. 02/25/17  Yes Shaune Pollackhen, Qing, MD  simvastatin (ZOCOR) 40 MG tablet Take 40 mg daily by mouth.   Yes [provider]    Review of Systems    He denies chest pain, palpitations, dyspnea, pnd, orthopnea, n, v, dizziness, syncope, edema, weight gain, or early satiety.  All other systems reviewed and are otherwise negative except as noted above.  Physical Exam    VS:  BP 132/82 (BP Location: Left Arm, Patient Position: Sitting, Cuff Size: Normal)   Pulse 64  Ht 6' (1.829 m)   Wt 256 lb 8 oz (116.3 kg)   BMI 34.79 kg/m  , BMI Body mass index is 34.79 kg/m. GEN: Well nourished, well developed, in no acute distress.  HEENT: normal.  Neck: Supple, no JVD, carotid bruits, or masses. Cardiac: Irregularly irregular, no murmurs, rubs, or gallops. No clubbing, cyanosis, edema.  Radials/DP/PT 2+ and equal bilaterally.  Respiratory:  Respirations regular and unlabored, clear to auscultation bilaterally. GI: Soft, nontender, nondistended, BS + x 4. MS: no deformity or atrophy. Skin: warm and dry, no rash. Neuro:  Strength  and sensation are intact. Psych: Normal affect.  Accessory Clinical Findings    ECG -atrial fibrillation, 64, left axis deviation, delayed R progression, nonspecific T changes.  Assessment & Plan    1.  Persistent atrial fibrillation: Patient was recently hospitalized with a 3-day history of lightheadedness was found to be in atrial fibrillation with rapid ventricular response.  He was placed on  blocker and Eliquis therapy and has since done well without recurrent symptoms.  CHA2DS2VASc is 2.  He has not missed any doses of Eliquis.  His echocardiogram showed normal LV function with a normal left atrial size.  We discussed typical management of atrial fibrillation and he is interested in pursuing cardioversion.  He will have to check with his sister to see when he can arrange for a ride but he is hopeful to be able to get this done next week.  We will check preprocedure labs today and he will call us back with dates of his availability.  2.  Essential hypertension: Stable on  blocker and ARB therapy.  3.  Hyperlipidemia: LDL was 68 November.  He remains on simvastatin therapy.  4.  Obstructive sleep apnea: He will need a follow-up sleep study and we can focus on this following cardioversion.  5.  Morbid obesity: He would benefit from a focused effort at increasing activity and restricting calories with a goal of weight loss.  Plan to readdress post cardioversion.  6.  Disposition: Follow-up labs today.  Plan for cardioversion next week if he is able to arrange a ride.  Nicolasa Duckinghristopher Aiven Kampe, NP 03/17/2017, 3:28 PM

## 2017-03-18 ENCOUNTER — Telehealth: Payer: Self-pay | Admitting: Nurse Practitioner

## 2017-03-18 LAB — BASIC METABOLIC PANEL
BUN/Creatinine Ratio: 19 (ref 10–24)
BUN: 18 mg/dL (ref 8–27)
CO2: 19 mmol/L — ABNORMAL LOW (ref 20–29)
Calcium: 9.4 mg/dL (ref 8.6–10.2)
Chloride: 106 mmol/L (ref 96–106)
Creatinine, Ser: 0.93 mg/dL (ref 0.76–1.27)
GFR, EST AFRICAN AMERICAN: 96 mL/min/{1.73_m2} (ref >59)
GFR, EST NON AFRICAN AMERICAN: 83 mL/min/{1.73_m2} (ref >59)
Glucose: 107 mg/dL — ABNORMAL HIGH (ref 65–99)
POTASSIUM: 4.2 mmol/L (ref 3.5–5.2)
SODIUM: 139 mmol/L (ref 134–144)

## 2017-03-18 LAB — CBC WITH DIFFERENTIAL/PLATELET
BASOS: 1 %
Basophils Absolute: 0 10*3/uL (ref 0.0–0.2)
EOS (ABSOLUTE): 0.5 10*3/uL — AB (ref 0.0–0.4)
Eos: 7 %
Hematocrit: 50.1 % (ref 37.5–51.0)
Hemoglobin: 17.3 g/dL (ref 13.0–17.7)
IMMATURE GRANS (ABS): 0 10*3/uL (ref 0.0–0.1)
Immature Granulocytes: 0 %
LYMPHS: 21 %
Lymphocytes Absolute: 1.5 10*3/uL (ref 0.7–3.1)
MCH: 32.3 pg (ref 26.6–33.0)
MCHC: 34.5 g/dL (ref 31.5–35.7)
MCV: 94 fL (ref 79–97)
Monocytes Absolute: 0.8 10*3/uL (ref 0.1–0.9)
Monocytes: 11 %
NEUTROS ABS: 4.1 10*3/uL (ref 1.4–7.0)
Neutrophils: 60 %
PLATELETS: 187 10*3/uL (ref 150–379)
RBC: 5.36 x10E6/uL (ref 4.14–5.80)
RDW: 13.5 % (ref 12.3–15.4)
WBC: 6.8 10*3/uL (ref 3.4–10.8)

## 2017-03-18 LAB — PROTIME-INR
INR: 7.3 (ref 0.8–1.2)
Prothrombin Time: 68 s — ABNORMAL HIGH (ref 9.1–12.0)

## 2017-03-18 NOTE — Telephone Encounter (Signed)
Pt calling stating he would like schedule his cardioversion  He was seen here yesterday

## 2017-03-18 NOTE — Telephone Encounter (Signed)
Reviewed echocardiogram results with patient and he advised that he would like to move forward with scheduling cardioversion. Advised that I would work on getting this scheduled and give him a call back with details. He verbalized understanding with no further questions at this time.

## 2017-03-18 NOTE — Telephone Encounter (Signed)
Spoke with patient and reviewed that since he has been on his Eliquis for 3 weeks they would like to reschedule. Rescheduled DCCV for 03/26/17 at 07:30AM. He verbalized understanding and had no further questions at this time.

## 2017-03-18 NOTE — Telephone Encounter (Signed)
Spoke with patient and scheduled DCCV for 03/20/17 at 07:30 AM with [facility]. Instructed him to not eat or drink anything after midnight prior and only sip of water with pills that morning. He verbalized understanding of all instructions with no further questions at this time.

## 2017-03-19 ENCOUNTER — Other Ambulatory Visit: Payer: Self-pay | Admitting: Nurse Practitioner

## 2017-03-22 ENCOUNTER — Other Ambulatory Visit: Payer: Self-pay | Admitting: Cardiovascular Disease

## 2017-03-26 ENCOUNTER — Telehealth: Payer: Self-pay | Admitting: Nurse Practitioner

## 2017-03-26 ENCOUNTER — Ambulatory Visit: Payer: Medicare Other | Admitting: Certified Registered Nurse Anesthetist

## 2017-03-26 ENCOUNTER — Encounter
Admission: RE | Disposition: A | Discharge status: Home / Self Care | Payer: Self-pay | Source: Ambulatory Visit | Attending: Cardiovascular Disease

## 2017-03-26 ENCOUNTER — Encounter: Payer: Self-pay | Admitting: Cardiovascular Disease

## 2017-03-26 ENCOUNTER — Ambulatory Visit
Admission: RE | Admit: 2017-03-26 | Discharge: 2017-03-26 | Disposition: A | Discharge status: Home / Self Care | Payer: Medicare Other | Source: Ambulatory Visit | Attending: Cardiovascular Disease | Admitting: Cardiovascular Disease

## 2017-03-26 DIAGNOSIS — I481 Persistent atrial fibrillation: Secondary | ICD-10-CM

## 2017-03-26 DIAGNOSIS — I4891 Unspecified atrial fibrillation: Secondary | ICD-10-CM | POA: Diagnosis present

## 2017-03-26 DIAGNOSIS — I10 Essential (primary) hypertension: Secondary | ICD-10-CM | POA: Diagnosis not present

## 2017-03-26 DIAGNOSIS — Z79899 Other long term (current) drug therapy: Secondary | ICD-10-CM | POA: Diagnosis not present

## 2017-03-26 HISTORY — PX: CARDIOVERSION: EP1203

## 2017-03-26 SURGERY — CARDIOVERSION (CATH LAB)
Anesthesia: General

## 2017-03-26 MED ORDER — SODIUM CHLORIDE 0.9% FLUSH: 3.0000 mL | INTRAVENOUS | Status: DC | PRN

## 2017-03-26 MED ORDER — PROPOFOL 10 MG/ML IV BOLUS
INTRAVENOUS | Status: AC
  Filled 2017-03-26: qty 20

## 2017-03-26 MED ORDER — SODIUM CHLORIDE 0.9% FLUSH: 3.0000 mL | Freq: Two times a day (BID) | INTRAVENOUS | Status: DC

## 2017-03-26 MED ORDER — EPHEDRINE SULFATE 50 MG/ML IJ SOLN
INTRAMUSCULAR | Status: AC
  Filled 2017-03-26: qty 1

## 2017-03-26 MED ORDER — SODIUM CHLORIDE 0.9 % IV SOLN
250.0000 mL | INTRAVENOUS | Status: DC
  Administered 2017-03-26: 250 mL via INTRAVENOUS

## 2017-03-26 MED ORDER — PROPOFOL 10 MG/ML IV BOLUS
INTRAVENOUS | Status: DC | PRN
Start: 2017-03-26 — End: 2017-03-26
  Administered 2017-03-26: 80 mg via INTRAVENOUS

## 2017-03-26 NOTE — Discharge Instructions (Signed)
Electrical Cardioversion, Care After °This sheet gives you information about how to care for yourself after your procedure. Your health care provider may also give you more specific instructions. If you have problems or questions, contact your health care provider. °What can I expect after the procedure? °After the procedure, it is common to have: °· Some redness on the skin where the shocks were given. ° °Follow these instructions at home: °· Do not drive for 24 hours if you were given a medicine to help you relax (sedative). °· Take over-the-counter and prescription medicines only as told by your health care provider. °· Ask your health care provider how to check your pulse. Check it often. °· Rest for 48 hours after the procedure or as told by your health care provider. °· Avoid or limit your caffeine use as told by your health care provider. °Contact a health care provider if: °· You feel like your heart is beating too quickly or your pulse is not regular. °· You have a serious muscle cramp that does not go away. °Get help right away if: °· You have discomfort in your chest. °· You are dizzy or you feel faint. °· You have trouble breathing or you are short of breath. °· Your speech is slurred. °· You have trouble moving an arm or leg on one side of your body. °· Your fingers or toes turn cold or blue. °This information is not intended to replace advice given to you by your health care provider. Make sure you discuss any questions you have with your health care provider. °Document Released: 01/19/2013 Document Revised: 11/02/2015 Document Reviewed: 10/05/2015 °Elsevier Interactive Patient Education © 2018 Elsevier Inc. ° °

## 2017-03-26 NOTE — Telephone Encounter (Signed)
Spoke with patient and he was instructed to call back with heart rate and blood pressure readings. He currently takes metoprolol 25 mg and takes 3 tablets twice daily. Denies any symptoms at this time. Advised that I will make physician aware and will call back with recommendations.

## 2017-03-26 NOTE — Transfer of Care (Signed)
Immediate Anesthesia Transfer of Care Note  Patient: Burnis MedinDavid Moise  Procedure(s) Performed: CARDIOVERSION (N/A )  Patient Location: PACU  Anesthesia Type:General  Level of Consciousness: sedated  Airway & Oxygen Therapy: Patient Spontanous Breathing and Patient connected to nasal cannula oxygen  Post-op Assessment: Report given to RN and Post -op Vital signs reviewed and stable  Post vital signs: Reviewed and stable  Last Vitals:  Vitals:   03/26/17 0745 03/26/17 0746  BP:    Pulse: (!) 49 (!) 47  Resp: (!) 26 (!) 24  Temp:    SpO2: 95% 94%    Last Pain:  Vitals:   03/26/17 0648  TempSrc: Oral         Complications: No apparent anesthesia complications

## 2017-03-26 NOTE — Telephone Encounter (Signed)
Pt calling in his BP readings. He was advised to call today after his procedure this morning    136/82  HR 49

## 2017-03-26 NOTE — Telephone Encounter (Signed)
Told nurse in specials to tell him to take at least metoprolol 25 BID (down from 75 BID) May be able to tolerate metoprolol 50 BID, will need to check numbers. For heart rate >60, try 50 BID

## 2017-03-26 NOTE — Anesthesia Preprocedure Evaluation (Signed)
Anesthesia Evaluation  Patient identified by MRN, date of birth, ID band Patient awake    Reviewed: Allergy & Precautions, NPO status , Patient's Chart, lab work & pertinent test results  History of Anesthesia Complications Negative for: history of anesthetic complications  Airway Mallampati: III       Dental   Pulmonary neg sleep apnea, neg COPD,           Cardiovascular hypertension, Pt. on medications (-) Past MI and (-) CHF + dysrhythmias Atrial Fibrillation (-) Valvular Problems/Murmurs     Neuro/Psych neg Seizures    GI/Hepatic Neg liver ROS, neg GERD  ,  Endo/Other  neg diabetes  Renal/GU negative Renal ROS     Musculoskeletal   Abdominal   Peds  Hematology   Anesthesia Other Findings   Reproductive/Obstetrics                             Anesthesia Physical Anesthesia Plan  ASA: III  Anesthesia Plan: General   Post-op Pain Management:    Induction: Intravenous  PONV Risk Score and Plan: 2 and TIVA and Propofol infusion  Airway Management Planned: Nasal Cannula  Additional Equipment:   Intra-op Plan:   Post-operative Plan:   Informed Consent: I have reviewed the patients History and Physical, chart, labs and discussed the procedure including the risks, benefits and alternatives for the proposed anesthesia with the patient or authorized representative who has indicated his/her understanding and acceptance.     Plan Discussed with:   Anesthesia Plan Comments:         Anesthesia Quick Evaluation

## 2017-03-26 NOTE — CV Procedure (Signed)
Cardioversion procedure note For atrial fibrillation, persistent.  Procedure Details:  Consent: Risks of procedure as well as the alternatives and risks of each were explained to the (patient/caregiver). Consent for procedure obtained.  Time Out: Verified patient identification, verified procedure, site/side was marked, verified correct patient position, special equipment/implants available, medications/allergies/relevent history reviewed, required imaging and test results available. Performed  Patient placed on cardiac monitor, pulse oximetry, supplemental oxygen as necessary.  Sedation given: propofol IV, Dr. Kephart Pacer pads placed anterior and posterior chest.   Cardioverted 1 time(s).  Cardioverted at  150 J. Synchronized biphasic Converted to NSR   Evaluation: Findings: Post procedure EKG shows: NSR Complications: None Patient did tolerate procedure well.  Time Spent Directly with the Patient:  45 minutes   Tim Gollan, M.D., Ph.D. 

## 2017-03-26 NOTE — Anesthesia Post-op Follow-up Note (Signed)
Anesthesia QCDR form completed.        

## 2017-03-26 NOTE — Progress Notes (Addendum)
Dr. Mariah MillingGollan paged to inquire about Pt meds post procedure. Pt on Metoprolol 75 mg twice daily, current HR SB 45-50. Per Dr. Mariah MillingGollan Pt to check HR and hold for HR less than 55. Advised to check HR this afternoon and call office with values, Pt to take 1-2 tabs (25 mg) twice daily. Office to advise going forward on 1 or tabs. Pt already had follow up scheduled for January 3rd with Cardiology NP

## 2017-03-26 NOTE — Anesthesia Procedure Notes (Signed)
Performed by: Koston Hennes, CRNA Pre-anesthesia Checklist: Patient identified, Emergency Drugs available, Suction available, Patient being monitored and Timeout performed Patient Re-evaluated:Patient Re-evaluated prior to induction Oxygen Delivery Method: Nasal cannula Induction Type: IV induction       

## 2017-03-26 NOTE — Anesthesia Postprocedure Evaluation (Signed)
Anesthesia Post Note  Patient: Jimmy Melendez  Procedure(s) Performed: CARDIOVERSION (N/A )  Patient location during evaluation: Other Anesthesia Type: General Level of consciousness: awake and alert Pain management: pain level controlled Vital Signs Assessment: post-procedure vital signs reviewed and stable Respiratory status: spontaneous breathing and respiratory function stable Cardiovascular status: stable Anesthetic complications: no     Last Vitals:  Vitals:   03/26/17 0746 03/26/17 0748  BP:  120/83  Pulse: (!) 47 (!) 51  Resp: (!) 24 (!) 25  Temp:    SpO2: 94% 97%    Last Pain:  Vitals:   03/26/17 0648  TempSrc: Oral                 KEPHART,WILLIAM K

## 2017-03-26 NOTE — Progress Notes (Signed)
Time out completed 0734 for Cardioversion with Dr. Mariah MillingGollan, CRNA C. Adan SisBeane, Teale Goodgame White RN

## 2017-03-27 NOTE — Telephone Encounter (Signed)
Called patient. He verbalized understanding of Dr Windell HummingbirdGollan's instructions. This morning heart rate is 56-58. Patient will take metoprolol 25 mg BID for now and try 50 mg BID if HR > 60.

## 2017-03-28 ENCOUNTER — Telehealth: Payer: Self-pay | Admitting: Adult Health

## 2017-03-28 NOTE — H&P (Signed)
H&P Addendum, pre-cardioversion  Patient was seen and evaluated prior to -cardioversion procedure Symptoms, prior testing details again confirmed with the patient Patient examined, no significant change from prior exam Lab work reviewed in detail personally by myself Patient understands risk and benefit of the procedure, willing to proceed  Signed, Tim Griselle Rufer, MD, Ph.D [facility]  

## 2017-03-28 NOTE — Telephone Encounter (Signed)
Patient called to report that he is back in atrial fib. He states that his HR is in the 60's. He continues to take metoprolol and Eliquis. I have advised him to continue this regimen. I have sent a not to triage to call and check on him on Monday 03/30/2017.

## 2017-03-30 ENCOUNTER — Telehealth: Payer: Self-pay | Admitting: *Deleted

## 2017-03-30 NOTE — Telephone Encounter (Signed)
-----   Message from Jodelle GrossKathryn M Lawrence, NP sent at 03/28/2017 10:43 AM EST ----- Regarding: Back in atrial fib Patient called to report that he was back in atrial fib. He is taking his medications as directed. HR in the 60's. I have advised him to continue the medications as directed. Please call him for changes in regimen and to check on him.

## 2017-03-30 NOTE — Telephone Encounter (Signed)
Spoke with the patient. He was DCCV'ed on 03/26/17. He went back in a-fib on 03/28/17 he believes.   03/29/17- BP/ HR- 121/81 (82) & 133/92 (72) 03/30/17- BP/ HR- 137/95 (77)  He is currently having no symptoms. He states the only way he knew he was back in a-fib was by checking his BP.   Confirmed he is currently on eliquis with no missed doses since his DCCV. He is also on lopressor 25 mg BID.   I advised him I will forward to Dr. Okey DupreEnd and Ward Givenshris Berge, NP for review and recommendations. I advised the patient that with controlled heart rates and lack of symptoms there is nothing that we would need to do urgently, but will await feedback from the provider. He is agreeable. He asked that we call and leave a message on his machine if he does not answer.  He currently has a follow up appointment with Ward Givenshris Berge, NP on 04/16/17.

## 2017-03-31 NOTE — Telephone Encounter (Signed)
I left a message of Dr. Serita KyleEnd's recommendations on the patient's identified voice mail.  I advised him I have scheduled him for follow up on Thursday 04/02/17 at 11:20 am with Dr. Okey DupreEnd. I asked that he call back if that does not work for him. I did leave his 04/16/17 appt scheduled for now.

## 2017-03-31 NOTE — Telephone Encounter (Signed)
Given reasonable heart rate and blood pressure, no urgent intervention is needed. Can we try to move up his appointment to see Ward Givenshris Berge or another provider in the office to this week? He should continue his current medications, including anticoagulation.  Yvonne Kendallhristopher Marlyss Cissell, MD [facility] Pager: [phone]

## 2017-04-01 NOTE — Telephone Encounter (Signed)
Confirmed with patient that he did get the message. He is aware of appointment date and time.

## 2017-04-02 ENCOUNTER — Encounter: Payer: Self-pay | Admitting: Internal Medicine

## 2017-04-02 ENCOUNTER — Ambulatory Visit (INDEPENDENT_AMBULATORY_CARE_PROVIDER_SITE_OTHER): Payer: Medicare Other | Admitting: Internal Medicine

## 2017-04-02 VITALS — BP 144/86 | HR 84 | Ht 72.0 in | Wt 259.5 lb

## 2017-04-02 DIAGNOSIS — R0609 Other forms of dyspnea: Secondary | ICD-10-CM | POA: Diagnosis not present

## 2017-04-02 DIAGNOSIS — I4891 Unspecified atrial fibrillation: Secondary | ICD-10-CM | POA: Diagnosis not present

## 2017-04-02 NOTE — Progress Notes (Signed)
Follow-up Outpatient Visit Date: 04/02/2017  Primary Care Provider: [facility] [address] [facility] [place] [id number]  Chief Complaint: Atrial fibrillation  HPI:  Jimmy Melendez is a 71 y.o. year-old male with history of persistent atrial fibrillation, hypertension, hyperlipidemia, prediabetes, sleep apnea, and restless leg syndrome, who presents for follow-up of atrial fibrillation.  He was hospitalized in mid November with atrial fibrillation.  After completing a month of anticoagulation, he underwent cardioversion on 03/26/17 with restoration of sinus rhythm.  He contacted our office 2 days later, stating that he thought he was back in atrial fibrillation. He checked his BP at home and noted that the BP cuff indicated an irregular heart beat.  Today, Jimmy Melendez feels well except for mild exertional dyspnea that began after he thought that he was back in a-fib. He denies chest pain, palpitations, lightheadedness, orthopnea, PND, and edema. He has been compliant with his medications, including metoprolol and apixaban. Home BP is typically 130-140/70-90.  --------------------------------------------------------------------------------------------------  Past Medical History:  Diagnosis Date  . Arthritis   . History of gout   . Hyperlipidemia   . Hypertension   . Hypogonadism in male   . Obstructive sleep apnea   . Persistent atrial fibrillation (HCC)    a. Dx 02/2017. CHA2DS2VASc = 2-->Eliquis; b. 02/2017 Echo: Ef 60-65%, no rwma, mild MR, nl LA size, nl PASP.  Marland Kitchen. Prediabetes    a. 02/2017 HbA1c 6.2.  . Restless legs syndrome (RLS)    Past Surgical History:  Procedure Laterality Date  . CARDIOVERSION N/A 03/26/2017   Procedure: CARDIOVERSION;  Surgeon: Antonieta IbaGollan, Timothy J, MD;  Location: [facility] ORS;  Service: Cardiovascular;  Laterality: N/A;  . NO PAST SURGERIES     Current Meds  Medication Sig  . acetaminophen (TYLENOL) 650 MG CR tablet Take 650 mg 2 (two) times daily by mouth.    Marland Kitchen. allopurinol (ZYLOPRIM) 300 MG tablet Take 300 mg daily by mouth.  Marland Kitchen. apixaban (ELIQUIS) 5 MG TABS tablet Take 1 tablet (5 mg total) by mouth 2 (two) times daily.  . fenofibrate 160 MG tablet Take 160 mg daily by mouth.  . fexofenadine (ALLEGRA) 180 MG tablet Take 180 mg daily as needed by mouth for allergies or rhinitis.  Marland Kitchen. gabapentin (NEURONTIN) 300 MG capsule Take 300 mg at bedtime by mouth.   . losartan (COZAAR) 100 MG tablet Take 100 mg daily by mouth.  . metoprolol tartrate (LOPRESSOR) 25 MG tablet Take 25 mg by mouth 2 (two) times daily.  . simvastatin (ZOCOR) 40 MG tablet Take 40 mg by mouth every evening.     Allergies: Patient has no known allergies.  Social History   Socioeconomic History  . Marital status: Married    Spouse name: Not on file  . Number of children: Not on file  . Years of education: Not on file  . Highest education level: Not on file  Social Needs  . Financial resource strain: Not on file  . Food insecurity - worry: Not on file  . Food insecurity - inability: Not on file  . Transportation needs - medical: Not on file  . Transportation needs - non-medical: Not on file  Occupational History  . Not on file  Tobacco Use  . Smoking status: Never Smoker  . Smokeless tobacco: Never Used  Substance and Sexual Activity  . Alcohol use: Yes    Alcohol/week: 1.2 oz    Types: 1 Shots of liquor, 1 Glasses of wine per week  Comment: drink a day  . Drug use: No  . Sexual activity: Not on file  Other Topics Concern  . Not on file  Social History Narrative  . Not on file    Family History  Problem Relation Age of Onset  . Cancer Father     Review of Systems: A 12-system review of systems was performed and was negative except as noted in the HPI.  --------------------------------------------------------------------------------------------------  Physical Exam: BP 118/90 (BP Location: Left Arm, Patient Position: Sitting, Cuff Size: Large)   Pulse  84   Ht 6' (1.829 m)   Wt 259 lb 8 oz (117.7 kg)   BMI 35.19 kg/m   Repeat BP 144/86  General:  Obese man, seated comfortably in the exam room. HEENT: No conjunctival pallor or scleral icterus. Moist mucous membranes.  OP clear. Neck: Supple without lymphadenopathy, thyromegaly, JVD, or HJR. Lungs: Normal work of breathing. Clear to auscultation bilaterally without wheezes or crackles. Heart: Irregularly irregular rhythm without murmurs. No rubs. Unable to assess PMI due to body habitus. Abd: Bowel sounds present. Soft, NT/ND. Unable to assess HSM due to body habitus. Ext: No lower extremity edema. Radial, PT, and DP pulses are 2+ bilaterally. Skin: Warm and dry without rash.  EKG:  Atrial fibrillation (ventricular rate 84 bpm) with poor R-wave progression.  Lab Results  Component Value Date   WBC 6.8 03/17/2017   HGB 17.3 03/17/2017   HCT 50.1 03/17/2017   MCV 94 03/17/2017   PLT 187 03/17/2017    Lab Results  Component Value Date   NA 139 03/17/2017   K 4.2 03/17/2017   CL 106 03/17/2017   CO2 19 (L) 03/17/2017   BUN 18 03/17/2017   CREATININE 0.93 03/17/2017   GLUCOSE 107 (H) 03/17/2017    Lab Results  Component Value Date   CHOL 128 02/25/2017   HDL 25 (L) 02/25/2017   LDLCALC 68 02/25/2017   TRIG 176 (H) 02/25/2017   CHOLHDL 5.1 02/25/2017    --------------------------------------------------------------------------------------------------  ASSESSMENT AND PLAN: Persistent atrial fibrillation Jimmy Melendez underwent successful DCCV by Dr. Mariah MillingGollan last week but is back in atrial fibrillation. Heart rate is reasonably well-controlled on metoprolol 25 mg BID. He has been compliant with apixaban. We discussed continued rate control strategy versus rhythm control. As he is minimally symptomatic, we will continue with rate control and obtain a myocardial perfusion stress test to exclude obstructive CAD. If this is normal, we could try flecainide in the future. We will not  make any medication changes today.  Dyspnea on exertion Very mild and new since Jimmy Melendez believes that he went back into atrial fibrillation on 12/15. He is reasonably rate-controlled today and does not have exam findings of heart failure. Echo also showed preserved LVEF. We will obtain pharmacologic myocardial perfusion stress test to exclude underlying CAD. No medication changes today.  Hypertension BP mildly elevated today. Sodium restriction and weight loss encouraged. No medication changes made; continue current doses of metoprolol and losartan.  Follow-up: Return to clinic in 3 months.  Yvonne Kendallhristopher Addam Goeller, MD 04/02/2017 11:30 AM

## 2017-04-02 NOTE — Patient Instructions (Addendum)
Medication Instructions:  Your physician recommends that you continue on your current medications as directed. Please refer to the Current Medication list given to you today.   Labwork: none  Testing/Procedures: Your physician has requested that you have a lexiscan myoview. For further information please visit [email or web address]. Please follow instruction sheet, as given.  [facility] LEXISCAN MYOVIEW  Your caregiver has ordered a Stress Test with nuclear imaging. The purpose of this test is to evaluate the blood supply to your heart muscle. This procedure is referred to as a "Non-Invasive Stress Test." This is because other than having an IV started in your vein, nothing is inserted or "invades" your body. Cardiac stress tests are done to find areas of poor blood flow to the heart by determining the extent of coronary artery disease (CAD). Some patients exercise on a treadmill, which naturally increases the blood flow to your heart, while others who are  unable to walk on a treadmill due to physical limitations have a pharmacologic/chemical stress agent called Lexiscan . This medicine will mimic walking on a treadmill by temporarily increasing your coronary blood flow.   Please note: these test may take anywhere between 2-4 hours to complete  [facility]  THE VOLUNTEERS AT THE FIRST DESK WILL DIRECT YOU WHERE TO GO  Date of Procedure:_____01/16/19_____________  Arrival Time for Procedure:__________07:15 AM___________  Instructions regarding medication:   _xx_:  Hold betablocker (METOPROLOL) night before procedure and morning of procedure    PLEASE NOTIFY THE OFFICE AT LEAST 24 HOURS IN ADVANCE IF YOU ARE UNABLE TO KEEP YOUR APPOINTMENT.  [phone] AND  PLEASE NOTIFY NUCLEAR MEDICINE AT [facility] AT LEAST 24 HOURS IN ADVANCE IF YOU ARE UNABLE TO KEEP YOUR APPOINTMENT. [phone]  How to prepare for your Myoview test:  1. Do not eat or drink after  midnight 2. No caffeine for 24 hours prior to test 3. No smoking 24 hours prior to test. 4. Your medication may be taken with water.  If your doctor stopped a medication because of this test, do not take that medication. 5. Ladies, please do not wear dresses.  Skirts or pants are appropriate. Please wear a short sleeve shirt. 6. No perfume, cologne or lotion. 7. Wear comfortable walking shoes. No heels!   Follow-Up: Your physician recommends that you schedule a follow-up appointment in: 3 MONTHS WITH DR END.   If you need a refill on your cardiac medications before your next appointment, please call your pharmacy.   Cardiac Nuclear Scan A cardiac nuclear scan is a test that measures blood flow to the heart when a person is resting and when he or she is exercising. The test looks for problems such as:  Not enough blood reaching a portion of the heart.  The heart muscle not working normally.  You may need this test if:  You have heart disease.  You have had abnormal lab results.  You have had heart surgery or angioplasty.  You have chest pain.  You have shortness of breath.  In this test, a radioactive dye (tracer) is injected into your bloodstream. After the tracer has traveled to your heart, an imaging device is used to measure how much of the tracer is absorbed by or distributed to various areas of your heart. This procedure is usually done at a hospital and takes 2-4 hours. Tell a health care provider about:  Any allergies you have.  All medicines you are taking, including vitamins, herbs, eye drops, creams, and  over-the-counter medicines.  Any problems you or family members have had with the use of anesthetic medicines.  Any blood disorders you have.  Any surgeries you have had.  Any medical conditions you have.  Whether you are pregnant or may be pregnant. What are the risks? Generally, this is a safe procedure. However, problems may occur,  including:  Serious chest pain and heart attack. This is only a risk if the stress portion of the test is done.  Rapid heartbeat.  Sensation of warmth in your chest. This usually passes quickly.  What happens before the procedure?  Ask your health care provider about changing or stopping your regular medicines. This is especially important if you are taking diabetes medicines or blood thinners.  Remove your jewelry on the day of the procedure. What happens during the procedure?  An IV tube will be inserted into one of your veins.  Your health care provider will inject a small amount of radioactive tracer through the tube.  You will wait for 20-40 minutes while the tracer travels through your bloodstream.  Your heart activity will be monitored with an electrocardiogram (ECG).  You will lie down on an exam table.  Images of your heart will be taken for about 15-20 minutes.  You may be asked to exercise on a treadmill or stationary bike. While you exercise, your heart's activity will be monitored with an ECG, and your blood pressure will be checked. If you are unable to exercise, you may be given a medicine to increase blood flow to parts of your heart.  When blood flow to your heart has peaked, a tracer will again be injected through the IV tube.  After 20-40 minutes, you will get back on the exam table and have more images taken of your heart.  When the procedure is over, your IV tube will be removed. The procedure may vary among health care providers and hospitals. Depending on the type of tracer used, scans may need to be repeated 3-4 hours later. What happens after the procedure?  Unless your health care provider tells you otherwise, you may return to your normal schedule, including diet, activities, and medicines.  Unless your health care provider tells you otherwise, you may increase your fluid intake. This will help flush the contrast dye from your body. Drink enough fluid  to keep your urine clear or pale yellow.  It is up to you to get your test results. Ask your health care provider, or the department that is doing the test, when your results will be ready. Summary  A cardiac nuclear scan measures the blood flow to the heart when a person is resting and when he or she is exercising.  You may need this test if you are at risk for heart disease.  Tell your health care provider if you are pregnant.  Unless your health care provider tells you otherwise, increase your fluid intake. This will help flush the contrast dye from your body. Drink enough fluid to keep your urine clear or pale yellow. This information is not intended to replace advice given to you by your health care provider. Make sure you discuss any questions you have with your health care provider. Document Released: 04/25/2004 Document Revised: 04/02/2016 Document Reviewed: 03/09/2013 Elsevier Interactive Patient Education  2017 [employer]

## 2017-04-03 ENCOUNTER — Encounter: Payer: Self-pay | Admitting: Internal Medicine

## 2017-04-03 DIAGNOSIS — R0609 Other forms of dyspnea: Secondary | ICD-10-CM | POA: Insufficient documentation

## 2017-04-16 ENCOUNTER — Ambulatory Visit: Payer: Medicare Other | Admitting: Nurse Practitioner

## 2017-04-29 ENCOUNTER — Encounter
Admission: RE | Admit: 2017-04-29 | Discharge: 2017-04-29 | Disposition: A | Discharge status: Home / Self Care | Payer: Medicare Other | Source: Ambulatory Visit | Attending: Internal Medicine | Admitting: Internal Medicine

## 2017-04-29 DIAGNOSIS — I4891 Unspecified atrial fibrillation: Secondary | ICD-10-CM | POA: Diagnosis not present

## 2017-04-29 DIAGNOSIS — R0609 Other forms of dyspnea: Secondary | ICD-10-CM | POA: Insufficient documentation

## 2017-04-29 LAB — NM MYOCAR MULTI W/SPECT W/WALL MOTION / EF
CSEPHR: 86 %
CSEPPHR: 129 {beats}/min
LV sys vol: 52 mL
LVDIAVOL: 83 mL (ref 62–150)
Rest HR: 89 {beats}/min
SDS: 5
SRS: 2
SSS: 5
TID: 1.23

## 2017-04-29 MED ORDER — REGADENOSON 0.4 MG/5ML IV SOLN
0.4000 mg | Freq: Once | INTRAVENOUS | Status: AC
  Administered 2017-04-29: 0.4 mg via INTRAVENOUS

## 2017-04-29 MED ORDER — TECHNETIUM TC 99M TETROFOSMIN IV KIT
12.6170 | PACK | Freq: Once | INTRAVENOUS | Status: AC | PRN
  Administered 2017-04-29: 12.617 via INTRAVENOUS

## 2017-04-29 MED ORDER — TECHNETIUM TC 99M TETROFOSMIN IV KIT
32.6320 | PACK | Freq: Once | INTRAVENOUS | Status: AC | PRN
  Administered 2017-04-29: 32.632 via INTRAVENOUS

## 2017-07-08 ENCOUNTER — Ambulatory Visit: Payer: Medicare Other | Admitting: Internal Medicine

## 2017-10-03 ENCOUNTER — Other Ambulatory Visit: Payer: Self-pay | Admitting: Nurse Practitioner

## 2018-05-07 DIAGNOSIS — G4733 Obstructive sleep apnea (adult) (pediatric): Secondary | ICD-10-CM | POA: Insufficient documentation

## 2018-05-07 DIAGNOSIS — E669 Obesity, unspecified: Secondary | ICD-10-CM | POA: Insufficient documentation

## 2019-07-11 DIAGNOSIS — E118 Type 2 diabetes mellitus with unspecified complications: Secondary | ICD-10-CM | POA: Insufficient documentation

## 2022-09-22 NOTE — Progress Notes (Unsigned)
Sleep Medicine   Office Visit  Patient Name: Jimmy Melendez DOB: 1945/06/24 MRN [id number]    Chief Complaint: OSA  Brief History:  Jimmy Melendez presents for an initial consult for sleep evaluation and to establish care. The patient has a 20+ year history of sleep apnea and is currently on a CPAP. Sleep quality is poor. This is noted most nights. Prior to using a PAP, the patient reported the following symptoms: snoring, fatigue, brain fogginess, some headaches. The patient goes to sleep at 1030 pm and wakes up at 0600 am. The patient no history of psychiatric problems. The Epworth Sleepiness Score is 5 out of 24 . The patient relates  Cardiovascular risk factors include: HTN and afib. The patient is currently on a CPAP@ 12 cmH2O. The patient reports using his PAP and feels rested after sleeping with PAP. Patient is in need of a replacement unit. The patient reports benefiting from PAP use and would like for him to continue using PAP. Reported sleepiness is improved. The compliance download shows  85% compliance with an average use time of 5 hours 29  minutes. The AHI is 3.1.  The patient continues to require PAP therapy as a medical necessity in order to eliminate his sleep apnea. Patient gets his supplies from [employer].   ROS  General: (-) fever, (-) chills, (-) night sweat Nose and Sinuses: (-) nasal stuffiness or itchiness, (-) postnasal drip, (-) nosebleeds, (-) sinus trouble. Mouth and Throat: (-) sore throat, (-) hoarseness. Neck: (-) swollen glands, (-) enlarged thyroid, (-) neck pain. Respiratory: - cough, - shortness of breath, - wheezing. Neurologic: - numbness, - tingling. Psychiatric: - anxiety, - depression Sleep behavior: -sleep paralysis -hypnogogic hallucinations -dream enactment      -vivid dreams -cataplexy -night terrors -sleep walking   Current Medication: Outpatient Encounter Medications as of 09/23/2022  Medication Sig   Adalimumab 40 MG/0.4ML PNKT Inject into the skin.    amLODipine (NORVASC) 5 MG tablet Take 1 tablet by mouth daily.   folic acid (FOLVITE) 1 MG tablet Take 1 tablet by mouth daily.   metFORMIN (GLUCOPHAGE) 500 MG tablet Take by mouth.   methotrexate (RHEUMATREX) 2.5 MG tablet Take by mouth.   acetaminophen (TYLENOL) 650 MG CR tablet Take 650 mg 2 (two) times daily by mouth.    allopurinol (ZYLOPRIM) 300 MG tablet Take 300 mg daily by mouth.   apixaban (ELIQUIS) 5 MG TABS tablet Take 1 tablet (5 mg total) by mouth 2 (two) times daily.   fenofibrate 160 MG tablet Take 160 mg daily by mouth.   fexofenadine (ALLEGRA) 180 MG tablet Take 180 mg daily as needed by mouth for allergies or rhinitis.   gabapentin (NEURONTIN) 300 MG capsule Take 300 mg at bedtime by mouth.    loperamide (IMODIUM) 2 MG capsule Take 2 mg by mouth 2 (two) times daily as needed.   losartan (COZAAR) 100 MG tablet Take 100 mg daily by mouth.   metoprolol tartrate (LOPRESSOR) 25 MG tablet Take 25 mg by mouth 2 (two) times daily.   simvastatin (ZOCOR) 40 MG tablet Take 40 mg by mouth every evening.    [DISCONTINUED] metoprolol tartrate (LOPRESSOR) 25 MG tablet Take 1 tablet (25 mg total) by mouth 2 (two) times daily.   No facility-administered encounter medications on file as of 09/23/2022.    Surgical History: Past Surgical History:  Procedure Laterality Date   CARDIOVERSION N/A 03/26/2017   Procedure: CARDIOVERSION;  Surgeon: Antonieta Iba, MD;  Location: [facility] ORS;  Service:  Cardiovascular;  Laterality: N/A;   NO PAST SURGERIES      Medical History: Past Medical History:  Diagnosis Date   Arthritis    History of gout    Hyperlipidemia    Hypertension    Hypogonadism in male    Obstructive sleep apnea    Persistent atrial fibrillation (HCC)    a. Dx 02/2017. CHA2DS2VASc = 2-->Eliquis; b. 02/2017 Echo: Ef 60-65%, no rwma, mild MR, nl LA size, nl PASP.   Prediabetes    a. 02/2017 HbA1c 6.2.   Restless legs syndrome (RLS)     Family History: Non  contributory to the present illness  Social History: Social History   Socioeconomic History   Marital status: Married    Spouse name: Not on file   Number of children: Not on file   Years of education: Not on file   Highest education level: Not on file  Occupational History   Not on file  Tobacco Use   Smoking status: Former    Types: Cigarettes   Smokeless tobacco: Never  Vaping Use   Vaping Use: Never used  Substance and Sexual Activity   Alcohol use: Yes    Alcohol/week: 2.0 standard drinks of alcohol    Types: 1 Shots of liquor, 1 Glasses of wine per week    Comment: drink a day   Drug use: No   Sexual activity: Not on file  Other Topics Concern   Not on file  Social History Narrative   Not on file   Social Determinants of Health   Financial Resource Strain: Not on file  Food Insecurity: Not on file  Transportation Needs: Not on file  Physical Activity: Not on file  Stress: Not on file  Social Connections: Not on file  Intimate Partner Violence: Not on file    Vital Signs: Blood pressure (!) 149/81, pulse 88, resp. rate 18, height 6' (1.829 m), weight 242 lb (109.8 kg), SpO2 98 %. Body mass index is 32.82 kg/m.   Examination: General Appearance: The patient is well-developed, well-nourished, and in no distress. Neck Circumference:  Skin: Gross inspection of skin unremarkable. Head: normocephalic, no gross deformities. Eyes: no gross deformities noted. ENT: ears appear grossly normal Neurologic: Alert and oriented. No involuntary movements.    STOP BANG RISK ASSESSMENT S (snore) Have you been told that you snore?     NO   T (tired) Are you often tired, fatigued, or sleepy during the day?   NO  O (obstruction) Do you stop breathing, choke, or gasp during sleep? NO   P (pressure) Do you have or are you being treated for high blood pressure? YES   B (BMI) Is your body index greater than 35 kg/m? NO   A (age) Are you 61 years old or older? YES   N  (neck) Do you have a neck circumference greater than 16 inches?   YES   G (gender) Are you a male? YES   TOTAL STOP/BANG "YES" ANSWERS 4                                                               A STOP-Bang score of 2 or less is considered low risk, and a score of 5 or more is high risk for having  either moderate or severe OSA. For people who score 3 or 4, doctors may need to perform further assessment to determine how likely they are to have OSA.         EPWORTH SLEEPINESS SCALE:  Scale:  (0)= no chance of dozing; (1)= slight chance of dozing; (2)= moderate chance of dozing; (3)= high chance of dozing  Chance  Situtation    Sitting and reading: 1    Watching TV: 1    Sitting Inactive in public: 0    As a passenger in car: 0      Lying down to rest: 2    Sitting and talking: 0    Sitting quielty after lunch: 1    In a car, stopped in traffic: 0   TOTAL SCORE:   5 out of 24    SLEEP STUDIES:  Not available.  CPAP COMPLIANCE DATA:  Date Range: 09/24/2021-09/23/2022  Average Daily Use: 5 hours 29 minutes  Median Use: 5 hours 41 minutes  Compliance for > 4 Hours: 85 %  AHI: 3.1 respiratory events per hour  Days Used: 362/365 days  Mask Leak: 31.2  95th Percentile Pressure: 12   LABS: No results found for this or any previous visit (from the past 2160 hour(s)).  Radiology: NM Myocar Multi W/Spect W/Wall Motion / EF  Result Date: 04/29/2017 Pharmacological myocardial perfusion imaging study with no significant ischemia Small region of fixed apical perfusion defect on attenuation corrected images, Perfusion abnormality not noted on non-attenuation corrected images Small GI uptake artifact noted. Normal wall motion, EF estimated at 44% No EKG changes concerning for ischemia at peak stress or in recovery. Low risk scan Signed, Dossie Arbour, MD, Ph.D [facility]    No results found.  No results found.    Assessment and Plan: Patient Active  Problem List   Diagnosis Date Noted   Hypertension 09/23/2022   Type 2 diabetes with complication (HCC) 07/11/2019   Obesity (BMI 30-39.9) 05/07/2018   OSA on CPAP 05/07/2018   DOE (dyspnea on exertion) 04/03/2017   A-fib (HCC) 02/24/2017   RLS (restless legs syndrome) 09/12/2014     PLAN OSA:   Patient evaluation suggests high risk of sleep disordered breathing due to hx of OSA, elevated BMI, and prior to PAP had snoring, fatigue, brain fogginess, and some headaches. Patient has comorbid cardiovascular risk factors including: HTN and afib which could be exacerbated by pathologic sleep-disordered breathing.  Suggest: PSG to assess/treat the patient's sleep disordered breathing. The patient was also counselled on wt loss to optimize sleep health.   1. OSA on CPAP Will order PSG for re-evaluation prior to replacement machine  2. CPAP use counseling CPAP couseling-Discussed importance of adequate CPAP use as well as proper care and cleaning techniques of machine and all supplies.  3. Primary hypertension Continue current medication and f/u with PCP.  4. Atrial fibrillation, unspecified type (HCC) Followed by cardiology  5. RLS (restless legs syndrome) Continue current medication and f/u with PCP.  6. Obesity (BMI 30-39.9) Obesity Counseling: Had a lengthy discussion regarding patients BMI and weight issues. Patient was instructed on portion control as well as increased activity. Also discussed caloric restrictions with trying to maintain intake less than 2000 Kcal. Discussions were made in accordance with the 5As of weight management. Simple actions such as not eating late and if able to, taking a walk is suggested.     General Counseling: I have discussed the findings of the evaluation and examination with Onalee Hua.  I have also discussed any further diagnostic evaluation thatmay be needed or ordered today. Onalee Hua verbalizes understanding of the findings of todays visit. We also  reviewed his medications today and discussed drug interactions and side effects including but not limited excessive drowsiness and altered mental states. We also discussed that there is always a risk not just to him but also people around him. he has been encouraged to call the office with any questions or concerns that should arise related to todays visit.  No orders of the defined types were placed in this encounter.       I have personally obtained a history, evaluated the patient, evaluated pertinent data, formulated the assessment and plan and placed orders.  This patient was seen by Lynn Ito, PA-C in collaboration with Dr. Freda Munro as a part of collaborative care agreement.    Yevonne Pax, MD [facility] Diplomate ABMS Pulmonary and Critical Care Medicine Sleep medicine

## 2022-09-23 ENCOUNTER — Ambulatory Visit (INDEPENDENT_AMBULATORY_CARE_PROVIDER_SITE_OTHER): Payer: Medicare Other | Admitting: Internal Medicine

## 2022-09-23 VITALS — BP 149/81 | HR 88 | Resp 18 | Ht 72.0 in | Wt 242.0 lb

## 2022-09-23 DIAGNOSIS — G4733 Obstructive sleep apnea (adult) (pediatric): Secondary | ICD-10-CM

## 2022-09-23 DIAGNOSIS — E669 Obesity, unspecified: Secondary | ICD-10-CM

## 2022-09-23 DIAGNOSIS — I4891 Unspecified atrial fibrillation: Secondary | ICD-10-CM

## 2022-09-23 DIAGNOSIS — Z7189 Other specified counseling: Secondary | ICD-10-CM

## 2022-09-23 DIAGNOSIS — G2581 Restless legs syndrome: Secondary | ICD-10-CM

## 2022-09-23 DIAGNOSIS — I10 Essential (primary) hypertension: Secondary | ICD-10-CM | POA: Insufficient documentation

## 2022-09-23 NOTE — Patient Instructions (Signed)
